# Patient Record
Sex: Female | Born: 1970 | Race: Black or African American | Hispanic: No | Smoking: Current every day smoker
Health system: Southern US, Community
[De-identification: ages and names within clinical notes are randomized; demographics above are authoritative.]

## PROBLEM LIST (undated history)

## (undated) DIAGNOSIS — R0600 Dyspnea, unspecified: Secondary | ICD-10-CM

## (undated) DIAGNOSIS — D649 Anemia, unspecified: Secondary | ICD-10-CM

## (undated) DIAGNOSIS — M1990 Unspecified osteoarthritis, unspecified site: Secondary | ICD-10-CM

## (undated) DIAGNOSIS — I10 Essential (primary) hypertension: Secondary | ICD-10-CM

## (undated) DIAGNOSIS — N939 Abnormal uterine and vaginal bleeding, unspecified: Secondary | ICD-10-CM

## (undated) DIAGNOSIS — R519 Headache, unspecified: Secondary | ICD-10-CM

## (undated) DIAGNOSIS — Z973 Presence of spectacles and contact lenses: Secondary | ICD-10-CM

## (undated) DIAGNOSIS — K219 Gastro-esophageal reflux disease without esophagitis: Secondary | ICD-10-CM

## (undated) DIAGNOSIS — N393 Stress incontinence (female) (male): Secondary | ICD-10-CM

## (undated) DIAGNOSIS — S83106A Unspecified dislocation of unspecified knee, initial encounter: Secondary | ICD-10-CM

## (undated) DIAGNOSIS — R7303 Prediabetes: Secondary | ICD-10-CM

## (undated) DIAGNOSIS — G8929 Other chronic pain: Secondary | ICD-10-CM

## (undated) DIAGNOSIS — R51 Headache: Secondary | ICD-10-CM

## (undated) HISTORY — PX: TUBAL LIGATION: SHX77

---

## 1998-11-12 ENCOUNTER — Emergency Department (HOSPITAL_COMMUNITY): Admission: EM | Admit: 1998-11-12 | Discharge: 1998-11-12 | Payer: Self-pay | Admitting: Emergency Medicine

## 2004-08-01 ENCOUNTER — Emergency Department (HOSPITAL_COMMUNITY): Admission: EM | Admit: 2004-08-01 | Discharge: 2004-08-01 | Payer: Self-pay | Admitting: *Deleted

## 2004-12-03 ENCOUNTER — Ambulatory Visit: Payer: Self-pay | Admitting: Family Medicine

## 2004-12-06 ENCOUNTER — Ambulatory Visit: Payer: Self-pay | Admitting: *Deleted

## 2004-12-24 ENCOUNTER — Ambulatory Visit: Payer: Self-pay | Admitting: Family Medicine

## 2005-02-04 ENCOUNTER — Ambulatory Visit: Payer: Self-pay | Admitting: Internal Medicine

## 2005-12-15 ENCOUNTER — Emergency Department (HOSPITAL_COMMUNITY): Admission: EM | Admit: 2005-12-15 | Discharge: 2005-12-15 | Payer: Self-pay | Admitting: Emergency Medicine

## 2006-02-03 ENCOUNTER — Emergency Department (HOSPITAL_COMMUNITY): Admission: EM | Admit: 2006-02-03 | Discharge: 2006-02-03 | Payer: Self-pay | Admitting: Emergency Medicine

## 2007-07-30 ENCOUNTER — Ambulatory Visit: Payer: Self-pay | Admitting: Family Medicine

## 2007-12-18 ENCOUNTER — Ambulatory Visit: Payer: Self-pay | Admitting: Family Medicine

## 2007-12-19 ENCOUNTER — Encounter (INDEPENDENT_AMBULATORY_CARE_PROVIDER_SITE_OTHER): Payer: Self-pay | Admitting: Family Medicine

## 2008-01-22 ENCOUNTER — Ambulatory Visit: Payer: Self-pay | Admitting: Family Medicine

## 2008-01-22 LAB — CONVERTED CEMR LAB
Basophils Absolute: 0.1 10*3/uL (ref 0.0–0.1)
CO2: 25 meq/L (ref 19–32)
Chloride: 105 meq/L (ref 96–112)
Cholesterol: 222 mg/dL — ABNORMAL HIGH (ref 0–200)
Creatinine, Ser: 1.13 mg/dL (ref 0.40–1.20)
Eosinophils Relative: 6 % — ABNORMAL HIGH (ref 0–5)
HCT: 42.4 % (ref 36.0–46.0)
LDL Cholesterol: 160 mg/dL — ABNORMAL HIGH (ref 0–99)
Lymphocytes Relative: 43 % (ref 12–46)
Lymphs Abs: 2 10*3/uL (ref 0.7–4.0)
Neutrophils Relative %: 40 % — ABNORMAL LOW (ref 43–77)
RDW: 15 % (ref 11.5–15.5)
TSH: 0.49 microintl units/mL (ref 0.350–5.50)
Total Bilirubin: 0.4 mg/dL (ref 0.3–1.2)
Total Protein: 7.8 g/dL (ref 6.0–8.3)
Triglycerides: 99 mg/dL (ref <150)
WBC: 4.7 10*3/uL (ref 4.0–10.5)

## 2008-02-25 ENCOUNTER — Ambulatory Visit: Payer: Self-pay | Admitting: Family Medicine

## 2008-02-25 ENCOUNTER — Encounter (INDEPENDENT_AMBULATORY_CARE_PROVIDER_SITE_OTHER): Payer: Self-pay | Admitting: Family Medicine

## 2008-02-25 LAB — CONVERTED CEMR LAB: Chlamydia, DNA Probe: NEGATIVE

## 2008-08-19 ENCOUNTER — Ambulatory Visit: Payer: Self-pay | Admitting: Internal Medicine

## 2008-08-21 ENCOUNTER — Ambulatory Visit: Payer: Self-pay | Admitting: Internal Medicine

## 2009-01-14 ENCOUNTER — Ambulatory Visit: Payer: Self-pay | Admitting: Internal Medicine

## 2009-01-15 ENCOUNTER — Ambulatory Visit: Payer: Self-pay | Admitting: Family Medicine

## 2009-08-05 ENCOUNTER — Emergency Department (HOSPITAL_COMMUNITY): Admission: EM | Admit: 2009-08-05 | Discharge: 2009-08-05 | Payer: Self-pay | Admitting: Emergency Medicine

## 2009-08-23 ENCOUNTER — Emergency Department (HOSPITAL_COMMUNITY): Admission: EM | Admit: 2009-08-23 | Discharge: 2009-08-23 | Payer: Self-pay | Admitting: Emergency Medicine

## 2009-11-05 ENCOUNTER — Emergency Department (HOSPITAL_COMMUNITY): Admission: EM | Admit: 2009-11-05 | Discharge: 2009-11-05 | Payer: Self-pay | Admitting: Emergency Medicine

## 2010-09-17 ENCOUNTER — Encounter (INDEPENDENT_AMBULATORY_CARE_PROVIDER_SITE_OTHER): Payer: Self-pay | Admitting: Family Medicine

## 2010-09-17 LAB — CONVERTED CEMR LAB
ALT: 15 units/L (ref 0–35)
AST: 21 units/L (ref 0–37)
Chlamydia, DNA Probe: NEGATIVE
Chloride: 103 meq/L (ref 96–112)
Cholesterol: 230 mg/dL — ABNORMAL HIGH (ref 0–200)
Glucose, Bld: 79 mg/dL (ref 70–99)
HDL: 42 mg/dL (ref >39)
Potassium: 5.3 meq/L (ref 3.5–5.3)
Sodium: 141 meq/L (ref 135–145)
TSH: 2.288 microintl units/mL (ref 0.350–4.500)
Total CHOL/HDL Ratio: 5.5
Total Protein: 7.4 g/dL (ref 6.0–8.3)
Triglycerides: 86 mg/dL (ref <150)

## 2011-05-29 ENCOUNTER — Emergency Department (HOSPITAL_COMMUNITY): Payer: Self-pay

## 2011-05-29 ENCOUNTER — Emergency Department (HOSPITAL_COMMUNITY)
Admission: EM | Admit: 2011-05-29 | Discharge: 2011-05-29 | Disposition: A | Discharge status: Home / Self Care | Payer: Self-pay | Attending: Emergency Medicine | Admitting: Emergency Medicine

## 2011-05-29 DIAGNOSIS — M6740 Ganglion, unspecified site: Secondary | ICD-10-CM | POA: Insufficient documentation

## 2011-05-29 DIAGNOSIS — M25539 Pain in unspecified wrist: Secondary | ICD-10-CM | POA: Insufficient documentation

## 2012-01-01 ENCOUNTER — Emergency Department (HOSPITAL_COMMUNITY): Payer: Self-pay

## 2012-01-01 ENCOUNTER — Encounter (HOSPITAL_COMMUNITY): Payer: Self-pay | Admitting: *Deleted

## 2012-01-01 ENCOUNTER — Emergency Department (HOSPITAL_COMMUNITY)
Admission: EM | Admit: 2012-01-01 | Discharge: 2012-01-01 | Disposition: A | Discharge status: Home / Self Care | Payer: Self-pay | Attending: Emergency Medicine | Admitting: Emergency Medicine

## 2012-01-01 DIAGNOSIS — Y9383 Activity, rough housing and horseplay: Secondary | ICD-10-CM | POA: Insufficient documentation

## 2012-01-01 DIAGNOSIS — S99919A Unspecified injury of unspecified ankle, initial encounter: Secondary | ICD-10-CM | POA: Insufficient documentation

## 2012-01-01 DIAGNOSIS — X500XXA Overexertion from strenuous movement or load, initial encounter: Secondary | ICD-10-CM | POA: Insufficient documentation

## 2012-01-01 DIAGNOSIS — M25569 Pain in unspecified knee: Secondary | ICD-10-CM | POA: Insufficient documentation

## 2012-01-01 DIAGNOSIS — S8990XA Unspecified injury of unspecified lower leg, initial encounter: Secondary | ICD-10-CM | POA: Insufficient documentation

## 2012-01-01 DIAGNOSIS — M25562 Pain in left knee: Secondary | ICD-10-CM

## 2012-01-01 DIAGNOSIS — R269 Unspecified abnormalities of gait and mobility: Secondary | ICD-10-CM | POA: Insufficient documentation

## 2012-01-01 DIAGNOSIS — Y92009 Unspecified place in unspecified non-institutional (private) residence as the place of occurrence of the external cause: Secondary | ICD-10-CM | POA: Insufficient documentation

## 2012-01-01 HISTORY — DX: Unspecified dislocation of unspecified knee, initial encounter: S83.106A

## 2012-01-01 MED ORDER — OXYCODONE-ACETAMINOPHEN 5-325 MG PO TABS
1.0000 | ORAL_TABLET | Freq: Once | ORAL | Status: AC
  Administered 2012-01-01: 1 via ORAL
  Filled 2012-01-01: qty 1

## 2012-01-01 MED ORDER — HYDROCODONE-ACETAMINOPHEN 5-325 MG PO TABS: 1.0000 | ORAL_TABLET | ORAL | Status: AC | PRN

## 2012-01-01 NOTE — ED Provider Notes (Signed)
History     CSN: [id number]  Arrival date & time 01/01/12  1433   First MD Initiated Contact with Patient 01/01/12 1510      Chief Complaint  Patient presents with  . Knee Injury    (Consider location/radiation/quality/duration/timing/severity/associated sxs/prior treatment) HPI History obtained from the patient. 41 year old female who states she was "wrestling" this afternoon, when her left leg/knee was twisted. She states she is currently unable to ambulate or bear weight on the extremity. Feels unable to fully extend the leg. She is able to move her extremity distally. She has not noticed any swelling, bruising, or gross deformity to the area. States she does have a history of dislocation of the knee, but otherwise has no past history of injury to the knee. She is not currently followed by an orthopedist.  Past Medical History  Diagnosis Date  . Knee dislocation     History reviewed. No pertinent past surgical history.  History reviewed. No pertinent family history.  History  Substance Use Topics  . Smoking status: Current Everyday Smoker -- 1.0 packs/day for 20 years  . Smokeless tobacco: Not on file  . Alcohol Use: Yes    OB History    Grav Para Term Preterm Abortions TAB SAB Ect Mult Living                  Review of Systems  Constitutional: Negative.   Musculoskeletal: Positive for arthralgias and gait problem. Negative for joint swelling.  Skin: Negative for color change and wound.  Neurological: Negative for weakness and numbness.    Allergies  Review of patient's allergies indicates no known allergies.  Home Medications  No current outpatient prescriptions on file.  LMP 12/31/2011  Physical Exam  Nursing note and vitals reviewed. Constitutional: She is oriented to person, place, and time. She appears well-developed and well-nourished. No distress.  HENT:  Head: Normocephalic and atraumatic.  Musculoskeletal:       Exam very limited, as the patient  is generally uncooperative with exam. There is no gross edema, ecchymoses, deformity noted to the knee. She has no bony tenderness. Patella is aligned. She is very mildly tender to palpation over the popliteal fossa, without evidence for Baker's cyst or gross joint effusion. The patella is not ballotable. Drawer and stress testing, although limited, did not reveal any gross ligamentous laxity. Unable to perform McMurray's.  Neurological: She is alert and oriented to person, place, and time.  Skin: She is not diaphoretic.    ED Course  Procedures (including critical care time)  Labs Reviewed - No data to display Dg Knee Complete 4 Views Left  01/01/2012  *RADIOLOGY REPORT*  Clinical Data: Twisted knee  LEFT KNEE - COMPLETE 4+ VIEW  Comparison: None.  Findings:  No fracture or dislocation of the left knee.  No joint effusion.  IMPRESSION: No fracture.  Original Report Authenticated By: Genevive Bi, M.D.   I personally reviewed the films; XR reviewed with pt.  1. Knee pain, left       MDM  41 year old female presenting after suffering an injury at home this afternoon. Her exam is very limited due to pain. I don't appreciate any gross effusion, edema, or deformity. Plain films are negative. We'll place her in a knee immobilizer and on crutches. She was instructed to practice RICE. She is instructed to follow up with orthopedist on improving in several days. Return precautions were discussed.        Allea Kassner, [place] 01/01/12 1710

## 2012-01-01 NOTE — ED Notes (Signed)
Patient was playing around, left leg got twisted. Pt c/o pain left knee with any type of movement. Unable to ambulate and bare any weight. Able to move foot and toes. Sensation present to extremity. Arrived per EMS>

## 2012-01-01 NOTE — Discharge Instructions (Signed)
Please wear the knee brace and use crutches for the next several days. Use the pain medicine as prescribed. Use ice, wrapped in a towel, to the area several times daily for 20 minutes at a time. Prop the knee up on a pillow. Please followup with Dr. supple if your knee is not improving for a recheck. Return to the ER for worsening condition.  Knee Pain The knee is the complex joint between your thigh and your lower leg. It is made up of bones, tendons, ligaments, and cartilage. The bones that make up the knee are:  The femur in the thigh.   The tibia and fibula in the lower leg.   The patella or kneecap riding in the groove on the lower femur.  CAUSES  Knee pain is a common complaint with many causes. A few of these causes are:  Injury, such as:   A ruptured ligament or tendon injury.   Torn cartilage.   Medical conditions, such as:   Gout   Arthritis   Infections   Overuse, over training or overdoing a physical activity.  Knee pain can be minor or severe. Knee pain can accompany debilitating injury. Minor knee problems often respond well to self-care measures or get well on their own. More serious injuries may need medical intervention or even surgery. SYMPTOMS The knee is complex. Symptoms of knee problems can vary widely. Some of the problems are:  Pain with movement and weight bearing.   Swelling and tenderness.   Buckling of the knee.   Inability to straighten or extend your knee.   Your knee locks and you cannot straighten it.   Warmth and redness with pain and fever.   Deformity or dislocation of the kneecap.  DIAGNOSIS  Determining what is wrong may be very straight forward such as when there is an injury. It can also be challenging because of the complexity of the knee. Tests to make a diagnosis may include:  Your caregiver taking a history and doing a physical exam.   Routine X-rays can be used to rule out other problems. X-rays will not reveal a cartilage  tear. Some injuries of the knee can be diagnosed by:   Arthroscopy a surgical technique by which a small video camera is inserted through tiny incisions on the sides of the knee. This procedure is used to examine and repair internal knee joint problems. Tiny instruments can be used during arthroscopy to repair the torn knee cartilage (meniscus).   Arthrography is a radiology technique. A contrast liquid is directly injected into the knee joint. Internal structures of the knee joint then become visible on X-ray film.   An MRI scan is a non x-ray radiology procedure in which magnetic fields and a computer produce two- or three-dimensional images of the inside of the knee. Cartilage tears are often visible using an MRI scanner. MRI scans have largely replaced arthrography in diagnosing cartilage tears of the knee.   Blood work.   Examination of the fluid that helps to lubricate the knee joint (synovial fluid). This is done by taking a sample out using a needle and a syringe.  TREATMENT The treatment of knee problems depends on the cause. Some of these treatments are:  Depending on the injury, proper casting, splinting, surgery or physical therapy care will be needed.   Give yourself adequate recovery time. Do not overuse your joints. If you begin to get sore during workout routines, back off. Slow down or do fewer repetitions.  For repetitive activities such as cycling or running, maintain your strength and nutrition.   Alternate muscle groups. For example if you are a weight lifter, work the upper body on one day and the lower body the next.   Either tight or weak muscles do not give the proper support for your knee. Tight or weak muscles do not absorb the stress placed on the knee joint. Keep the muscles surrounding the knee strong.   Take care of mechanical problems.   If you have flat feet, orthotics or special shoes may help. See your caregiver if you need help.   Arch supports,  sometimes with wedges on the inner or outer aspect of the heel, can help. These can shift pressure away from the side of the knee most bothered by osteoarthritis.   A brace called an "unloader" brace also may be used to help ease the pressure on the most arthritic side of the knee.   If your caregiver has prescribed crutches, braces, wraps or ice, use as directed. The acronym for this is PRICE. This means protection, rest, ice, compression and elevation.   Nonsteroidal anti-inflammatory drugs (NSAID's), can help relieve pain. But if taken immediately after an injury, they may actually increase swelling. Take NSAID's with food in your stomach. Stop them if you develop stomach problems. Do not take these if you have a history of ulcers, stomach pain or bleeding from the bowel. Do not take without your caregiver's approval if you have problems with fluid retention, heart failure, or kidney problems.   For ongoing knee problems, physical therapy may be helpful.   Glucosamine and chondroitin are over-the-counter dietary supplements. Both may help relieve the pain of osteoarthritis in the knee. These medicines are different from the usual anti-inflammatory drugs. Glucosamine may decrease the rate of cartilage destruction.   Injections of a corticosteroid drug into your knee joint may help reduce the symptoms of an arthritis flare-up. They may provide pain relief that lasts a few months. You may have to wait a few months between injections. The injections do have a small increased risk of infection, water retention and elevated blood sugar levels.   Hyaluronic acid injected into damaged joints may ease pain and provide lubrication. These injections may work by reducing inflammation. A series of shots may give relief for as long as 6 months.   Topical painkillers. Applying certain ointments to your skin may help relieve the pain and stiffness of osteoarthritis. Ask your pharmacist for suggestions. Many over  the-counter products are approved for temporary relief of arthritis pain.   In some countries, doctors often prescribe topical NSAID's for relief of chronic conditions such as arthritis and tendinitis. A review of treatment with NSAID creams found that they worked as well as oral medications but without the serious side effects.  PREVENTION  Maintain a healthy weight. Extra pounds put more strain on your joints.   Get strong, stay limber. Weak muscles are a common cause of knee injuries. Stretching is important. Include flexibility exercises in your workouts.   Be smart about exercise. If you have osteoarthritis, chronic knee pain or recurring injuries, you may need to change the way you exercise. This does not mean you have to stop being active. If your knees ache after jogging or playing basketball, consider switching to swimming, water aerobics or other low-impact activities, at least for a few days a week. Sometimes limiting high-impact activities will provide relief.   Make sure your shoes fit well. Choose footwear  that is right for your sport.   Protect your knees. Use the proper gear for knee-sensitive activities. Use kneepads when playing volleyball or laying carpet. Buckle your seat belt every time you drive. Most shattered kneecaps occur in car accidents.   Rest when you are tired.  SEEK MEDICAL CARE IF:  You have knee pain that is continual and does not seem to be getting better.  SEEK IMMEDIATE MEDICAL CARE IF:  Your knee joint feels hot to the touch and you have a high fever. MAKE SURE YOU:   Understand these instructions.   Will watch your condition.   Will get help right away if you are not doing well or get worse.  Document Released: 08/14/2007 Document Revised: 10/06/2011 Document Reviewed: 08/14/2007 [facility] Patient Information 2012 [place], [place].

## 2012-01-01 NOTE — ED Notes (Signed)
Per EMS- pt in c/o left knee injury, unable to move knee, no obvious deformity but has history of knee dislocation

## 2012-01-03 NOTE — ED Provider Notes (Signed)
Medical screening examination/treatment/procedure(s) were performed by non-physician practitioner and as supervising physician I was immediately available for consultation/collaboration.   Laray Anger, DO 01/03/12 2323

## 2012-08-15 ENCOUNTER — Emergency Department (HOSPITAL_COMMUNITY): Payer: Self-pay

## 2012-08-15 ENCOUNTER — Encounter (HOSPITAL_COMMUNITY): Payer: Self-pay | Admitting: *Deleted

## 2012-08-15 ENCOUNTER — Emergency Department (HOSPITAL_COMMUNITY)
Admission: EM | Admit: 2012-08-15 | Discharge: 2012-08-15 | Disposition: A | Discharge status: Home / Self Care | Payer: Self-pay | Attending: Emergency Medicine | Admitting: Emergency Medicine

## 2012-08-15 DIAGNOSIS — N730 Acute parametritis and pelvic cellulitis: Secondary | ICD-10-CM | POA: Insufficient documentation

## 2012-08-15 DIAGNOSIS — A599 Trichomoniasis, unspecified: Secondary | ICD-10-CM | POA: Insufficient documentation

## 2012-08-15 DIAGNOSIS — R109 Unspecified abdominal pain: Secondary | ICD-10-CM | POA: Insufficient documentation

## 2012-08-15 HISTORY — DX: Headache: R51

## 2012-08-15 HISTORY — DX: Headache, unspecified: R51.9

## 2012-08-15 HISTORY — DX: Other chronic pain: G89.29

## 2012-08-15 LAB — PREGNANCY, URINE: Preg Test, Ur: NEGATIVE

## 2012-08-15 LAB — URINALYSIS, ROUTINE W REFLEX MICROSCOPIC
Bilirubin Urine: NEGATIVE
Glucose, UA: NEGATIVE mg/dL
Ketones, ur: NEGATIVE mg/dL
Nitrite: NEGATIVE
Protein, ur: NEGATIVE mg/dL
Specific Gravity, Urine: 1.021 (ref 1.005–1.030)

## 2012-08-15 LAB — WET PREP, GENITAL: Clue Cells Wet Prep HPF POC: NONE SEEN

## 2012-08-15 MED ORDER — ACETAMINOPHEN-CODEINE #3 300-30 MG PO TABS: 1.0000 | ORAL_TABLET | Freq: Four times a day (QID) | ORAL | Status: DC | PRN

## 2012-08-15 MED ORDER — IBUPROFEN 600 MG PO TABS: 600.0000 mg | ORAL_TABLET | Freq: Four times a day (QID) | ORAL | Status: DC | PRN

## 2012-08-15 MED ORDER — CEFTRIAXONE SODIUM 250 MG IJ SOLR
250.0000 mg | Freq: Once | INTRAMUSCULAR | Status: AC
  Administered 2012-08-15: 250 mg via INTRAMUSCULAR
  Filled 2012-08-15: qty 250

## 2012-08-15 MED ORDER — DOXYCYCLINE HYCLATE 100 MG PO CAPS: 100.0000 mg | ORAL_CAPSULE | Freq: Two times a day (BID) | ORAL | Status: DC

## 2012-08-15 MED ORDER — LIDOCAINE HCL (PF) 1 % IJ SOLN
INTRAMUSCULAR | Status: AC
  Administered 2012-08-15: 11:00:00
  Filled 2012-08-15: qty 5

## 2012-08-15 MED ORDER — AZITHROMYCIN 1 G PO PACK
1.0000 g | PACK | Freq: Once | ORAL | Status: AC
  Administered 2012-08-15: 1 g via ORAL
  Filled 2012-08-15: qty 1

## 2012-08-15 MED ORDER — HYDROCODONE-ACETAMINOPHEN 5-325 MG PO TABS
1.0000 | ORAL_TABLET | Freq: Once | ORAL | Status: AC
  Administered 2012-08-15: 1 via ORAL
  Filled 2012-08-15: qty 1

## 2012-08-15 MED ORDER — METRONIDAZOLE 500 MG PO TABS
2000.0000 mg | ORAL_TABLET | Freq: Once | ORAL | Status: AC
  Administered 2012-08-15: 2000 mg via ORAL
  Filled 2012-08-15: qty 4

## 2012-08-15 NOTE — ED Provider Notes (Signed)
History     CSN: [id number]  Arrival date & time 08/15/12  4098   First MD Initiated Contact with Patient 08/15/12 [phone]      Chief Complaint  Patient presents with  . Abdominal Pain    (Consider location/radiation/quality/duration/timing/severity/associated sxs/prior treatment) HPI Comments: 40 Y/O FEMALE S/ TUBAL LIGATION COMES IN WITH CC OF ABD PAIN. Pt has suprapubic abd pain that woke her up from her sleep last night. The pain is sharp, constant with no specific aggravating or relieving factos. She has no associated n/v/f/c. Pt has no UTI like sx, and she has no hx of renal stones. The pain is non radiating and located on both the left and right side. No vaginal discharge, she is having some bleeding as she is on her periods.   Patient is a 41 y.o. female presenting with abdominal pain. The history is provided by the patient.  Abdominal Pain The primary symptoms of the illness include abdominal pain and vaginal bleeding. The primary symptoms of the illness do not include shortness of breath, nausea, vomiting, diarrhea or dysuria.  Symptoms associated with the illness do not include constipation or hematuria.    Past Medical History  Diagnosis Date  . Knee dislocation   . Chronic headaches     Past Surgical History  Procedure Date  . Tubal ligation     No family history on file.  History  Substance Use Topics  . Smoking status: Current Every Day Smoker -- 1.0 packs/day for 20 years  . Smokeless tobacco: Not on file  . Alcohol Use: Yes    OB History    Grav Para Term Preterm Abortions TAB SAB Ect Mult Living                  Review of Systems  Constitutional: Positive for activity change.  HENT: Negative for facial swelling and neck pain.   Respiratory: Negative for cough, shortness of breath and wheezing.   Cardiovascular: Negative for chest pain.  Gastrointestinal: Positive for abdominal pain. Negative for nausea, vomiting, diarrhea, constipation, blood in  stool and abdominal distention.  Genitourinary: Positive for vaginal bleeding. Negative for dysuria, hematuria, flank pain and difficulty urinating.  Skin: Negative for color change.  Neurological: Negative for speech difficulty and headaches.  Hematological: Does not bruise/bleed easily.  Psychiatric/Behavioral: Negative for confusion.    Allergies  Review of patient's allergies indicates no known allergies.  Home Medications   Current Outpatient Rx  Name Route Sig Dispense Refill  . BC FAST PAIN RELIEF PO Oral Take by mouth every 6 (six) hours as needed. For pain      BP 135/89  Pulse 67  Temp 97.7 F (36.5 C) (Oral)  Resp 20  SpO2 97%  Physical Exam  Nursing note and vitals reviewed. Constitutional: She is oriented to person, place, and time. She appears well-developed.  HENT:  Head: Normocephalic and atraumatic.  Eyes: Conjunctivae normal and EOM are normal. Pupils are equal, round, and reactive to light.  Neck: Normal range of motion. Neck supple.  Cardiovascular: Normal rate, regular rhythm, normal heart sounds and intact distal pulses.   No murmur heard. Pulmonary/Chest: Effort normal. No respiratory distress. She has no wheezes.  Abdominal: Soft. Bowel sounds are normal. She exhibits no distension. There is tenderness. There is guarding. There is no rebound.       Suprapubic tenderness  Genitourinary: Vagina normal and uterus normal.       External exam - normal, no lesions Speculum exam:+  bloody discharge Bimanual exam: Patient has + CMT, no adnexal tenderness or fullness and cervical os is closed  Neurological: She is alert and oriented to person, place, and time.  Skin: Skin is warm and dry.    ED Course  Procedures (including critical care time)   Labs Reviewed  URINALYSIS, ROUTINE W REFLEX MICROSCOPIC  PREGNANCY, URINE  GC/CHLAMYDIA PROBE AMP, GENITAL  WET PREP, GENITAL   No results found.   No diagnosis found.    MDM  DDx includes: Ovarian  cyst TOA Ectopic pregnancy PID STD Thrombosis Mesenteric ischemia Diverticulitis Peritonitis Appendicitis Hernia Nephrolithiasis Pyelonephritis UTI/Cystitis  Healthy woman comes in with cc of abd pain. Suprapubic, with no specific aggravating or relieving factors, with some guarding on her exam. She also has CMT. The pain is quite severe, and she has subjective fevers, chills - will get [place] to make sure there is no abscess, but likely this is just PID.    Derwood Kaplan, MD 08/15/12 [phone]

## 2012-08-15 NOTE — ED Notes (Signed)
Patient transported to CT 

## 2012-08-15 NOTE — ED Notes (Signed)
Pt with lmp started 2 days ago states she awoke at 3 am with pelvic cramping.  States "I feel like I'm having another baby".

## 2012-08-16 LAB — GC/CHLAMYDIA PROBE AMP, GENITAL
Chlamydia, DNA Probe: NEGATIVE
GC Probe Amp, Genital: NEGATIVE

## 2013-05-29 ENCOUNTER — Emergency Department (HOSPITAL_COMMUNITY)
Admission: EM | Admit: 2013-05-29 | Discharge: 2013-05-29 | Disposition: A | Discharge status: Home / Self Care | Payer: Self-pay | Attending: Emergency Medicine | Admitting: Emergency Medicine

## 2013-05-29 ENCOUNTER — Encounter (HOSPITAL_COMMUNITY): Payer: Self-pay

## 2013-05-29 DIAGNOSIS — H6123 Impacted cerumen, bilateral: Secondary | ICD-10-CM

## 2013-05-29 DIAGNOSIS — Z8679 Personal history of other diseases of the circulatory system: Secondary | ICD-10-CM | POA: Insufficient documentation

## 2013-05-29 DIAGNOSIS — H6120 Impacted cerumen, unspecified ear: Secondary | ICD-10-CM | POA: Insufficient documentation

## 2013-05-29 DIAGNOSIS — F17200 Nicotine dependence, unspecified, uncomplicated: Secondary | ICD-10-CM | POA: Insufficient documentation

## 2013-05-29 MED ORDER — DOCUSATE SODIUM 50 MG/5ML PO LIQD
5.0000 mg | Freq: Once | ORAL | Status: AC
  Administered 2013-05-29: 5 mg via OTIC
  Filled 2013-05-29: qty 10

## 2013-05-29 MED ORDER — CARBAMIDE PEROXIDE 6.5 % OT SOLN: 3.0000 [drp] | Freq: Two times a day (BID) | OTIC | Status: DC

## 2013-05-29 MED ORDER — DOCUSATE SODIUM 50 MG/5ML PO LIQD
10.0000 mg | Freq: Once | ORAL | Status: DC
  Filled 2013-05-29: qty 10

## 2013-05-29 NOTE — ED Notes (Signed)
Pt c/o lt ear pain after cleaning with a q tip on saturday

## 2013-05-29 NOTE — ED Provider Notes (Signed)
CSN: [id number]     Arrival date & time 05/29/13  4098 History     First MD Initiated Contact with Patient 05/29/13 1011     Chief Complaint  Patient presents with  . Otalgia   (Consider location/radiation/quality/duration/timing/severity/associated sxs/prior Treatment) The history is provided by the patient. No language interpreter was used.  Kristin Dean is a 42 y/o F with PMHx of chronic headaches presenting to the ED with left ear pain that started on Saturday after cleaning her ears with a qtip. Patient reported that the pain occurred instantaneously after using the qtip, described as a constant soreness to the left ear with mild muffled sounds, with radiation of discomfort to the left temple region. Stated that she has used nothing for the discomfort. Denied fever, chills, neck pain, neck stiffness, back pain, swelling of the ears, otorrhea. PCP none   Past Medical History  Diagnosis Date  . Knee dislocation   . Chronic headaches    Past Surgical History  Procedure Laterality Date  . Tubal ligation     No family history on file. History  Substance Use Topics  . Smoking status: Current Every Day Smoker -- 1.00 packs/day for 20 years  . Smokeless tobacco: Not on file  . Alcohol Use: Yes     Comment: social   OB History   Grav Para Term Preterm Abortions TAB SAB Ect Mult Living                 Review of Systems  Constitutional: Negative for fever and chills.  HENT: Positive for ear pain (left). Negative for congestion, sore throat, trouble swallowing, neck pain, neck stiffness, sinus pressure and ear discharge.   Eyes: Negative for visual disturbance.  Respiratory: Negative for cough, chest tightness and shortness of breath.   Cardiovascular: Negative for chest pain.  Neurological: Negative for weakness and numbness.  All other systems reviewed and are negative.    Allergies  Review of patient's allergies indicates no known allergies.  Home Medications    Current Outpatient Rx  Name  Route  Sig  Dispense  Refill  . carbamide peroxide (DEBROX) 6.5 % otic solution   Both Ears   Place 3 drops into both ears 2 (two) times daily.   15 mL   0    BP 128/66  Pulse 72  Temp(Src) 98 F (36.7 C) (Oral)  Resp 22  SpO2 98%  LMP 05/22/2013 Physical Exam  Nursing note and vitals reviewed. Constitutional: She is oriented to person, place, and time. She appears well-developed and well-nourished. No distress.  HENT:  Head: Normocephalic and atraumatic.  Negative pain upon palpation to the face Bilateral cerumen impaction  Eyes: Conjunctivae and EOM are normal. Pupils are equal, round, and reactive to light. Right eye exhibits no discharge. Left eye exhibits no discharge.  Neck: Normal range of motion. Neck supple. No tracheal deviation present.  Negative pain upon palpation to the neck Negative neck stiffness Negative nuchal rigidity  Cardiovascular: Normal rate, regular rhythm and normal heart sounds.  Exam reveals no friction rub.   No murmur heard. Pulmonary/Chest: Effort normal and breath sounds normal. No respiratory distress. She has no wheezes. She has no rales.  Lymphadenopathy:    She has no cervical adenopathy.  Neurological: She is alert and oriented to person, place, and time. She exhibits normal muscle tone. Coordination normal.  Skin: Skin is warm and dry. No rash noted. She is not diaphoretic. No erythema.  Psychiatric: She has a  normal mood and affect. Her behavior is normal. Thought content normal.    ED Course   Procedures (including critical care time)  Medications  docusate (COLACE) 50 MG/5ML liquid 5 mg (5 mg Both Ears Given 05/29/13 1116)    Labs Reviewed - No data to display No results found. 1. Cerumen impaction, bilateral     MDM  Patient presenting to the ED with otalgia to the left ear that started Saturday after patient used qtip to clean ears. Cerumen impaction bilaterally - negative drainage,  swelling, erythema, inflammation noted to the canals bilaterally. Negative sign of otitis externa. Negative dental problems or issues noted. Colace placed in ears bilaterally - small amount of wax removed with curette. Patient stable, afebrile. Cerumen impaction noted, bilaterally. Discharged patient with [place]brox and ENT follow-up. Discussed with patient to no longer use qtips - discussed proper cleaning with rag. Discussed with patient to continue to monitor symptoms and if symptoms are to worsen or change to report back to the ED - strict return instructions given.  Patient agreed to plan of care, understood, all questions answered.   Raymon Mutton, PA-C 05/29/13 2128

## 2013-05-29 NOTE — Progress Notes (Signed)
[facility] provided pt with a list of primary care resources and a [facility] app.  °

## 2013-05-30 NOTE — ED Provider Notes (Signed)
  Medical screening examination/treatment/procedure(s) were performed by non-physician practitioner and as supervising physician I was immediately available for consultation/collaboration.   Gila Lauf, MD 05/30/13 0722 

## 2013-08-29 ENCOUNTER — Telehealth (HOSPITAL_COMMUNITY): Payer: Self-pay | Admitting: *Deleted

## 2013-08-29 NOTE — Telephone Encounter (Signed)
Telephone patient at home # and left message to return call to [facility] about a mammogram

## 2013-10-03 ENCOUNTER — Other Ambulatory Visit: Payer: Self-pay | Admitting: Obstetrics and Gynecology

## 2013-10-03 DIAGNOSIS — Z1231 Encounter for screening mammogram for malignant neoplasm of breast: Secondary | ICD-10-CM

## 2013-10-22 ENCOUNTER — Ambulatory Visit (HOSPITAL_COMMUNITY)
Admission: RE | Admit: 2013-10-22 | Discharge: 2013-10-22 | Disposition: A | Discharge status: Home / Self Care | Payer: Self-pay | Source: Ambulatory Visit | Attending: Obstetrics and Gynecology | Admitting: Obstetrics and Gynecology

## 2013-10-22 ENCOUNTER — Encounter (HOSPITAL_COMMUNITY): Payer: Self-pay

## 2013-10-22 VITALS — BP 116/64 | Temp 98.3°F | Ht 63.0 in | Wt 223.2 lb

## 2013-10-22 DIAGNOSIS — Z1231 Encounter for screening mammogram for malignant neoplasm of breast: Secondary | ICD-10-CM

## 2013-10-22 DIAGNOSIS — Z01419 Encounter for gynecological examination (general) (routine) without abnormal findings: Secondary | ICD-10-CM

## 2013-10-22 NOTE — Progress Notes (Signed)
Patient complained of AUB.  Pap Smear:    Pap smear completed today. Patients last Pap smear was 09/17/2010 and normal. Per patient has no history of an abnormal Pap smear. Pap smear result above is in EPIC.  Physical exam: Breasts Breasts symmetrical. No skin abnormalities bilateral breasts. No nipple retraction bilateral breasts. No nipple discharge bilateral breasts. No lymphadenopathy. No lumps palpated bilateral breasts. No complaints of pain or tenderness on exam. Patient escorted to mammography for a screening mammogram.          Pelvic/Bimanual   Ext Genitalia No lesions, no swelling and no discharge observed on external genitalia.         Vagina Vagina pink and normal texture. No lesions or discharge observed in vagina.          Cervix Cervix is present. Cervix pink and of normal texture. Cervix friable. No discharge observed. Will refer patient to the [facility] for the AUB.    Uterus Uterus is present and palpable. Uterus in normal position and normal size.        Adnexae Bilateral ovaries present and palpable. No tenderness on palpation.          Rectovaginal No rectal exam completed today since patient had no rectal complaints. No skin abnormalities observed on exam.

## 2013-10-22 NOTE — Patient Instructions (Addendum)
Taught [name] how to perform BSE and gave educational materials to take home. Let her know [facility] will cover Pap smears every 3 years unless has a history of abnormal Pap smears. Let patient know will follow up with her within the next couple weeks with results by letter and/or phone. Patient complained of AUB. Let patient know will refer her to the [facility] for follow up and that [facility] does not cover. Told her will call her with follow up appointment. [name] verbalized understanding. Patient escorted to mammography for a screening mammogram.  Jamieka Royle, Kathaleen Maser, RN 2:29 PM

## 2013-12-05 ENCOUNTER — Encounter: Payer: Self-pay | Admitting: Family Medicine

## 2014-01-22 ENCOUNTER — Encounter: Payer: Self-pay | Admitting: Family Medicine

## 2014-06-27 ENCOUNTER — Emergency Department (HOSPITAL_COMMUNITY)
Admission: EM | Admit: 2014-06-27 | Discharge: 2014-06-28 | Disposition: A | Discharge status: Home / Self Care | Payer: No Typology Code available for payment source | Attending: Emergency Medicine | Admitting: Emergency Medicine

## 2014-06-27 ENCOUNTER — Encounter (HOSPITAL_COMMUNITY): Payer: Self-pay | Admitting: Emergency Medicine

## 2014-06-27 DIAGNOSIS — G8929 Other chronic pain: Secondary | ICD-10-CM | POA: Insufficient documentation

## 2014-06-27 DIAGNOSIS — S93409A Sprain of unspecified ligament of unspecified ankle, initial encounter: Secondary | ICD-10-CM | POA: Diagnosis not present

## 2014-06-27 DIAGNOSIS — Y9289 Other specified places as the place of occurrence of the external cause: Secondary | ICD-10-CM | POA: Diagnosis not present

## 2014-06-27 DIAGNOSIS — X500XXA Overexertion from strenuous movement or load, initial encounter: Secondary | ICD-10-CM | POA: Diagnosis not present

## 2014-06-27 DIAGNOSIS — Y9389 Activity, other specified: Secondary | ICD-10-CM | POA: Insufficient documentation

## 2014-06-27 DIAGNOSIS — Z8781 Personal history of (healed) traumatic fracture: Secondary | ICD-10-CM | POA: Diagnosis not present

## 2014-06-27 DIAGNOSIS — F17200 Nicotine dependence, unspecified, uncomplicated: Secondary | ICD-10-CM | POA: Insufficient documentation

## 2014-06-27 DIAGNOSIS — Z79899 Other long term (current) drug therapy: Secondary | ICD-10-CM | POA: Insufficient documentation

## 2014-06-27 DIAGNOSIS — S99929A Unspecified injury of unspecified foot, initial encounter: Secondary | ICD-10-CM | POA: Diagnosis present

## 2014-06-27 DIAGNOSIS — S93402A Sprain of unspecified ligament of left ankle, initial encounter: Secondary | ICD-10-CM

## 2014-06-27 DIAGNOSIS — S8990XA Unspecified injury of unspecified lower leg, initial encounter: Secondary | ICD-10-CM | POA: Insufficient documentation

## 2014-06-27 DIAGNOSIS — S99919A Unspecified injury of unspecified ankle, initial encounter: Secondary | ICD-10-CM

## 2014-06-27 MED ORDER — HYDROCODONE-ACETAMINOPHEN 5-325 MG PO TABS
2.0000 | ORAL_TABLET | Freq: Once | ORAL | Status: AC
  Administered 2014-06-27: 2 via ORAL
  Filled 2014-06-27: qty 2

## 2014-06-27 NOTE — ED Notes (Signed)
Pt. twisted her left ankle while ambulating this evening , presents with pain and mild swelling at left ankle.

## 2014-06-27 NOTE — ED Provider Notes (Signed)
CSN: [id number]     Arrival date & time 06/27/14  2234 History   First MD Initiated Contact with Patient 06/27/14 2245     Chief Complaint  Patient presents with  . Ankle Injury   Patient is a 43 y.o. female presenting with lower extremity injury.  Ankle Injury Pertinent negatives include no numbness or weakness.    History provided by the patient. Patient is a 43 year old female presenting with left ankle pain and injury. She reports walking outside returning home when she twisted her left ankle. She had acute pains which have been worsening significantly. She was able to bear weight on the ankle and foot but this is becoming more difficult. She reports swelling to the lateral aspect. Denies any weakness or numbness. No other injuries or complaints. Patient did use 3 ibuprofen at home and ice with elevation. No significant change in symptoms.   Past Medical History  Diagnosis Date  . Knee dislocation   . Chronic headaches    Past Surgical History  Procedure Laterality Date  . Tubal ligation     Family History  Problem Relation Age of Onset  . Diabetes Mother   . Hypertension Father   . Breast cancer Maternal Aunt    History  Substance Use Topics  . Smoking status: Current Every Day Smoker -- 1.00 packs/day for 20 years  . Smokeless tobacco: Not on file  . Alcohol Use: Yes     Comment: social   OB History   Grav Para Term Preterm Abortions TAB SAB Ect Mult Living   Review of Systems  Neurological: Negative for weakness and numbness.  All other systems reviewed and are negative.     Allergies  Review of patient's allergies indicates no known allergies.  Home Medications   Prior to Admission medications   Medication Sig Start Date End Date Taking? Authorizing Provider  carbamide peroxide (DEBROX) 6.5 % otic solution Place 3 drops into both ears 2 (two) times daily. 05/29/13   Marissa Sciacca, PA-C   BP 118/73  Pulse 86  Temp(Src) 98.2 F (36.8  C) (Oral)  Resp 16  SpO2 100%  LMP 06/10/2014 Physical Exam  Nursing note and vitals reviewed. Constitutional: She is oriented to person, place, and time. She appears well-developed and well-nourished. No distress.  HENT:  Head: Normocephalic and atraumatic.  Cardiovascular: Normal rate and regular rhythm.   Pulmonary/Chest: Effort normal and breath sounds normal. No respiratory distress.  Musculoskeletal: She exhibits edema.  Moderate swelling over the left lateral malleolus of the ankle. There is tenderness throughout this area. No tenderness or swelling over the proximal fifth metatarsal. No pain or deformity over the medial malleolus. No proximal fibular tenderness. Normal knee exam. Dorsal pedal pulses and sensation in the foot.  Neurological: She is alert and oriented to person, place, and time.  Skin: Skin is warm and dry. No rash noted.  Psychiatric: She has a normal mood and affect. Her behavior is normal.    ED Course  Procedures   COORDINATION OF CARE:  Nursing notes reviewed. Vital signs reviewed. Initial pt interview and examination performed.   Filed Vitals:   06/27/14 2241  BP: 118/73  Pulse: 86  Temp: 98.2 F (36.8 C)  TempSrc: Oral  Resp: 16  SpO2: 100%    11:09 PM-patient seen and evaluated. She appears well, in no acute distress. X-rays ordered.   x-rays reviewed with the patient.  No fractures or dislocations. Suspect ankle sprain. ASO and crutches provided.  Treatment plan initiated: Medications  HYDROcodone-acetaminophen (NORCO/VICODIN) 5-325 MG per tablet 2 tablet (2 tablets Oral Given 06/27/14 2253)     Imaging Review Dg Ankle Complete Left  06/28/2014   CLINICAL DATA:  Twisting injury to the left ankle. Pain and lateral swelling.  EXAM: LEFT ANKLE COMPLETE - 3+ VIEW  COMPARISON:  None.  FINDINGS: Lateral soft tissue swelling. No acute fracture or dislocation. Soft tissue calcifications, likely phleboliths. Os trigone. Small plantar calcaneal  spur.  IMPRESSION: Lateral soft tissue swelling.  No acute fracture or dislocation.   Electronically Signed   By: Burman Nieves M.D.   On: 06/28/2014 00:50     MDM   Final diagnoses:  Ankle sprain, left, initial encounter        Angus Seller, PA-C 06/28/14 2238

## 2014-06-28 ENCOUNTER — Emergency Department (HOSPITAL_COMMUNITY): Payer: No Typology Code available for payment source

## 2014-06-28 MED ORDER — NAPROXEN 500 MG PO TABS: 500.0000 mg | ORAL_TABLET | Freq: Two times a day (BID) | ORAL | Status: DC

## 2014-06-28 NOTE — ED Notes (Signed)
Pt to xray

## 2014-06-28 NOTE — ED Notes (Addendum)
Pt was instructed on how to apply aso ankle and walk on crutches. Demonstrated understanding.

## 2014-06-28 NOTE — ED Notes (Signed)
Pt back from x-ray.

## 2014-06-28 NOTE — Discharge Instructions (Signed)
Your x-rays do not show any broken bones or other concerning injury. Your providers feel you have a sprained ankle. Use rest, ice, compression and elevation to reduce pain and swelling.   Ankle Sprain An ankle sprain is an injury to the strong, fibrous tissues (ligaments) that hold the bones of your ankle joint together.  CAUSES An ankle sprain is usually caused by a fall or by twisting your ankle. Ankle sprains most commonly occur when you step on the outer edge of your foot, and your ankle turns inward. People who participate in sports are more prone to these types of injuries.  SYMPTOMS   Pain in your ankle. The pain may be present at rest or only when you are trying to stand or walk.  Swelling.  Bruising. Bruising may develop immediately or within 1 to 2 days after your injury.  Difficulty standing or walking, particularly when turning corners or changing directions. DIAGNOSIS  Your caregiver will ask you details about your injury and perform a physical exam of your ankle to determine if you have an ankle sprain. During the physical exam, your caregiver will press on and apply pressure to specific areas of your foot and ankle. Your caregiver will try to move your ankle in certain ways. An X-ray exam may be done to be sure a bone was not broken or a ligament did not separate from one of the bones in your ankle (avulsion fracture).  TREATMENT  Certain types of braces can help stabilize your ankle. Your caregiver can make a recommendation for this. Your caregiver may recommend the use of medicine for pain. If your sprain is severe, your caregiver may refer you to a surgeon who helps to restore function to parts of your skeletal system (orthopedist) or a physical therapist. HOME CARE INSTRUCTIONS   Apply ice to your injury for 1-2 days or as directed by your caregiver. Applying ice helps to reduce inflammation and pain.  Put ice in a plastic bag.  Place a towel between your skin and the  bag.  Leave the ice on for 15-20 minutes at a time, every 2 hours while you are awake.  Only take over-the-counter or prescription medicines for pain, discomfort, or fever as directed by your caregiver.  Elevate your injured ankle above the level of your heart as much as possible for 2-3 days.  If your caregiver recommends crutches, use them as instructed. Gradually put weight on the affected ankle. Continue to use crutches or a cane until you can walk without feeling pain in your ankle.  If you have a plaster splint, wear the splint as directed by your caregiver. Do not rest it on anything harder than a pillow for the first 24 hours. Do not put weight on it. Do not get it wet. You may take it off to take a shower or bath.  You may have been given an elastic bandage to wear around your ankle to provide support. If the elastic bandage is too tight (you have numbness or tingling in your foot or your foot becomes cold and blue), adjust the bandage to make it comfortable.  If you have an air splint, you may blow more air into it or let air out to make it more comfortable. You may take your splint off at night and before taking a shower or bath. Wiggle your toes in the splint several times per day to decrease swelling. SEEK MEDICAL CARE IF:   You have rapidly increasing bruising or swelling.  Your toes feel extremely cold or you lose feeling in your foot.  Your pain is not relieved with medicine. SEEK IMMEDIATE MEDICAL CARE IF:  Your toes are numb or blue.  You have severe pain that is increasing. MAKE SURE YOU:   Understand these instructions.  Will watch your condition.  Will get help right away if you are not doing well or get worse. Document Released: 10/17/2005 Document Revised: 07/11/2012 Document Reviewed: 10/29/2011 [facility] Patient Information 2015 [place], [place]. This information is not intended to replace advice given to you by your health care provider. Make sure you  discuss any questions you have with your health care provider.

## 2014-07-07 NOTE — ED Provider Notes (Signed)
Medical screening examination/treatment/procedure(s) were performed by non-physician practitioner and as supervising physician I was immediately available for consultation/collaboration.   EKG Interpretation None       Jomarion Mish R. Delphin Funes, MD 07/07/14 1439 

## 2014-09-01 ENCOUNTER — Encounter (HOSPITAL_COMMUNITY): Payer: Self-pay | Admitting: Emergency Medicine

## 2014-10-21 ENCOUNTER — Other Ambulatory Visit: Payer: Self-pay | Admitting: Obstetrics and Gynecology

## 2014-10-21 DIAGNOSIS — Z1231 Encounter for screening mammogram for malignant neoplasm of breast: Secondary | ICD-10-CM

## 2014-11-27 ENCOUNTER — Ambulatory Visit (HOSPITAL_COMMUNITY): Admission: RE | Admit: 2014-11-27 | Payer: Self-pay | Source: Ambulatory Visit

## 2014-11-27 ENCOUNTER — Ambulatory Visit (HOSPITAL_COMMUNITY): Payer: Self-pay | Attending: Obstetrics and Gynecology

## 2015-08-03 ENCOUNTER — Emergency Department (HOSPITAL_COMMUNITY)
Admission: EM | Admit: 2015-08-03 | Discharge: 2015-08-03 | Disposition: A | Discharge status: Home / Self Care | Payer: No Typology Code available for payment source | Attending: Emergency Medicine | Admitting: Emergency Medicine

## 2015-08-03 ENCOUNTER — Encounter (HOSPITAL_COMMUNITY): Payer: Self-pay | Admitting: *Deleted

## 2015-08-03 ENCOUNTER — Emergency Department (HOSPITAL_COMMUNITY): Payer: No Typology Code available for payment source

## 2015-08-03 DIAGNOSIS — J40 Bronchitis, not specified as acute or chronic: Secondary | ICD-10-CM

## 2015-08-03 DIAGNOSIS — Z87828 Personal history of other (healed) physical injury and trauma: Secondary | ICD-10-CM | POA: Insufficient documentation

## 2015-08-03 DIAGNOSIS — G933 Postviral fatigue syndrome: Secondary | ICD-10-CM | POA: Insufficient documentation

## 2015-08-03 DIAGNOSIS — G8929 Other chronic pain: Secondary | ICD-10-CM | POA: Insufficient documentation

## 2015-08-03 DIAGNOSIS — R05 Cough: Secondary | ICD-10-CM

## 2015-08-03 DIAGNOSIS — Z791 Long term (current) use of non-steroidal anti-inflammatories (NSAID): Secondary | ICD-10-CM | POA: Insufficient documentation

## 2015-08-03 DIAGNOSIS — R058 Other specified cough: Secondary | ICD-10-CM

## 2015-08-03 DIAGNOSIS — Z720 Tobacco use: Secondary | ICD-10-CM

## 2015-08-03 DIAGNOSIS — R059 Cough, unspecified: Secondary | ICD-10-CM

## 2015-08-03 MED ORDER — BENZONATATE 200 MG PO CAPS: 200.0000 mg | ORAL_CAPSULE | Freq: Three times a day (TID) | ORAL | Status: DC | PRN

## 2015-08-03 MED ORDER — AZITHROMYCIN 250 MG PO TABS: ORAL_TABLET | ORAL | Status: DC

## 2015-08-03 NOTE — ED Notes (Signed)
Declined W/C at D/C and was escorted to lobby by RN. 

## 2015-08-03 NOTE — Discharge Instructions (Signed)
Continue to stay well-hydrated. Continue to alternate between Tylenol and Ibuprofen for pain or fever. Use Mucinex for cough suppression/expectoration of mucus. Use Tesslon pearls for cough suppression. Use netipot and flonase to help with nasal congestion. May consider over-the-counter Benadryl or other antihistamine to decrease secretions and for watery itchy eyes. STOP SMOKING! If you develop a fever or the cough worsens abruptly, then start taking the antibiotic as prescribed. Use the list below to find a regular doctor. You can follow up with them or with the women's clinic for any ongoing mammogram concerns or needs. Follow up with a primary care doctor from the list below in 1 week for recheck. Return to the ER for changes or worsening symptoms.   Cough, Adult  A cough is a reflex that helps clear your throat and airways. It can help heal the body or may be a reaction to an irritated airway. A cough may only last 2 or 3 weeks (acute) or may last more than 8 weeks (chronic).  CAUSES Acute cough:  Viral or bacterial infections. Chronic cough:  Infections.  Allergies.  Asthma.  Post-nasal drip.  Smoking.  Heartburn or acid reflux.  Some medicines.  Chronic lung problems (COPD).  Cancer. SYMPTOMS   Cough.  Fever.  Chest pain.  Increased breathing rate.  High-pitched whistling sound when breathing (wheezing).  Colored mucus that you cough up (sputum). TREATMENT   A bacterial cough may be treated with antibiotic medicine.  A viral cough must run its course and will not respond to antibiotics.  Your caregiver may recommend other treatments if you have a chronic cough. HOME CARE INSTRUCTIONS   Only take over-the-counter or prescription medicines for pain, discomfort, or fever as directed by your caregiver. Use cough suppressants only as directed by your caregiver.  Use a cold steam vaporizer or humidifier in your bedroom or home to help loosen secretions.  Sleep  in a semi-upright position if your cough is worse at night.  Rest as needed.  Stop smoking if you smoke. SEEK IMMEDIATE MEDICAL CARE IF:   You have pus in your sputum.  Your cough starts to worsen.  You cannot control your cough with suppressants and are losing sleep.  You begin coughing up blood.  You have difficulty breathing.  You develop pain which is getting worse or is uncontrolled with medicine.  You have a fever. MAKE SURE YOU:   Understand these instructions.  Will watch your condition.  Will get help right away if you are not doing well or get worse. Document Released: 04/15/2011 Document Revised: 01/09/2012 Document Reviewed: 04/15/2011 [facility] Patient Information 2015 [place], [place]. This information is not intended to replace advice given to you by your health care provider. Make sure you discuss any questions you have with your health care provider.  Smoking Cessation, Tips for Success If you are ready to quit smoking, congratulations! You have chosen to help yourself be healthier. Cigarettes bring nicotine, tar, carbon monoxide, and other irritants into your body. Your lungs, heart, and blood vessels will be able to work better without these poisons. There are many different ways to quit smoking. Nicotine gum, nicotine patches, a nicotine inhaler, or nicotine nasal spray can help with physical craving. Hypnosis, support groups, and medicines help break the habit of smoking. WHAT THINGS CAN I DO TO MAKE QUITTING EASIER?  Here are some tips to help you quit for good:  Pick a date when you will quit smoking completely. Tell all of your friends and  family about your plan to quit on that date.  Do not try to slowly cut down on the number of cigarettes you are smoking. Pick a quit date and quit smoking completely starting on that day.  Throw away all cigarettes.   Clean and remove all ashtrays from your home, work, and car.  On a card, write down your reasons  for quitting. Carry the card with you and read it when you get the urge to smoke.  Cleanse your body of nicotine. Drink enough water and fluids to keep your urine clear or pale yellow. Do this after quitting to flush the nicotine from your body.  Learn to predict your moods. Do not let a bad situation be your excuse to have a cigarette. Some situations in your life might tempt you into wanting a cigarette.  Never have "just one" cigarette. It leads to wanting another and another. Remind yourself of your decision to quit.  Change habits associated with smoking. If you smoked while driving or when feeling stressed, try other activities to replace smoking. Stand up when drinking your coffee. Brush your teeth after eating. Sit in a different chair when you read the paper. Avoid alcohol while trying to quit, and try to drink fewer caffeinated beverages. Alcohol and caffeine may urge you to smoke.  Avoid foods and drinks that can trigger a desire to smoke, such as sugary or spicy foods and alcohol.  Ask people who smoke not to smoke around you.  Have something planned to do right after eating or having a cup of coffee. For example, plan to take a walk or exercise.  Try a relaxation exercise to calm you down and decrease your stress. Remember, you may be tense and nervous for the first 2 weeks after you quit, but this will pass.  Find new activities to keep your hands busy. Play with a pen, coin, or rubber band. Doodle or draw things on paper.  Brush your teeth right after eating. This will help cut down on the craving for the taste of tobacco after meals. You can also try mouthwash.   Use oral substitutes in place of cigarettes. Try using lemon drops, carrots, cinnamon sticks, or chewing gum. Keep them handy so they are available when you have the urge to smoke.  When you have the urge to smoke, try deep breathing.  Designate your home as a nonsmoking area.  If you are a heavy smoker, ask your  health care provider about a prescription for nicotine chewing gum. It can ease your withdrawal from nicotine.  Reward yourself. Set aside the cigarette money you save and buy yourself something nice.  Look for support from others. Join a support group or smoking cessation program. Ask someone at home or at work to help you with your plan to quit smoking.  Always ask yourself, "Do I need this cigarette or is this just a reflex?" Tell yourself, "Today, I choose not to smoke," or "I do not want to smoke." You are reminding yourself of your decision to quit.  Do not replace cigarette smoking with electronic cigarettes (commonly called e-cigarettes). The safety of e-cigarettes is unknown, and some may contain harmful chemicals.  If you relapse, do not give up! Plan ahead and think about what you will do the next time you get the urge to smoke. HOW WILL I FEEL WHEN I QUIT SMOKING? You may have symptoms of withdrawal because your body is used to nicotine (the addictive substance in cigarettes).  You may crave cigarettes, be irritable, feel very hungry, cough often, get headaches, or have difficulty concentrating. The withdrawal symptoms are only temporary. They are strongest when you first quit but will go away within 10-14 days. When withdrawal symptoms occur, stay in control. Think about your reasons for quitting. Remind yourself that these are signs that your body is healing and getting used to being without cigarettes. Remember that withdrawal symptoms are easier to treat than the major diseases that smoking can cause.  Even after the withdrawal is over, expect periodic urges to smoke. However, these cravings are generally short lived and will go away whether you smoke or not. Do not smoke! WHAT RESOURCES ARE AVAILABLE TO HELP ME QUIT SMOKING? Your health care provider can direct you to community resources or hospitals for support, which may include:  Group  support.  Education.  Hypnosis.  Therapy. Document Released: 07/15/2004 Document Revised: 03/03/2014 Document Reviewed: 04/04/2013 [facility] Patient Information 2015 [place], [place]. This information is not intended to replace advice given to you by your health care provider. Make sure you discuss any questions you have with your health care provider.  Upper Respiratory Infection, Adult An upper respiratory infection (URI) is also known as the common cold. It is often caused by a type of germ (virus). Colds are easily spread (contagious). You can pass it to others by kissing, coughing, sneezing, or drinking out of the same glass. Usually, you get better in 1 or 2 weeks.  HOME CARE   Only take medicine as told by your doctor.  Use a warm mist humidifier or breathe in steam from a hot shower.  Drink enough water and fluids to keep your pee (urine) clear or pale yellow.  Get plenty of rest.  Return to work when your temperature is back to normal or as told by your doctor. You may use a face mask and wash your hands to stop your cold from spreading. GET HELP RIGHT AWAY IF:   After the first few days, you feel you are getting worse.  You have questions about your medicine.  You have chills, shortness of breath, or brown or red spit (mucus).  You have yellow or brown snot (nasal discharge) or pain in the face, especially when you bend forward.  You have a fever, puffy (swollen) neck, pain when you swallow, or white spots in the back of your throat.  You have a bad headache, ear pain, sinus pain, or chest pain.  You have a high-pitched whistling sound when you breathe in and out (wheezing).  You have a lasting cough or cough up blood.  You have sore muscles or a stiff neck. MAKE SURE YOU:   Understand these instructions.  Will watch your condition.  Will get help right away if you are not doing well or get worse. Document Released: 04/04/2008 Document Revised: 01/09/2012  Document Reviewed: 01/22/2014 [facility] Patient Information 2015 [place], [place]. This information is not intended to replace advice given to you by your health care provider. Make sure you discuss any questions you have with your health care provider.   Emergency Department Resource Guide 1) Find a Doctor and Pay Out of Pocket Although you won't have to find out who is covered by your insurance plan, it is a good idea to ask around and get recommendations. You will then need to call the office and see if the doctor you have chosen will accept you as a new patient and what types of options they offer  for patients who are self-pay. Some doctors offer discounts or will set up payment plans for their patients who do not have insurance, but you will need to ask so you aren't surprised when you get to your appointment.  2) Contact Your Local Health Department Not all health departments have doctors that can see patients for sick visits, but many do, so it is worth a call to see if yours does. If you don't know where your local health department is, you can check in your phone book. The CDC also has a tool to help you locate your state's health department, and many state websites also have listings of all of their local health departments.  3) Find a Walk-in Clinic If your illness is not likely to be very severe or complicated, you may want to try a walk in clinic. These are popping up all over the country in pharmacies, drugstores, and shopping centers. They're usually staffed by nurse practitioners or physician assistants that have been trained to treat common illnesses and complaints. They're usually fairly quick and inexpensive. However, if you have serious medical issues or chronic medical problems, these are probably not your best option.  No Primary Care Doctor: - Call [facility] at  [phone] - they can help you locate a primary care doctor that  accepts your insurance, provides certain services,  etc. - Physician Referral Service- [phone]  Chronic Pain Problems: Organization         Address  Phone   Notes  [facility]  [phone] Patients need to be referred by their primary care doctor.   Medication Assistance: Organization         Address  Phone   Notes  [facility] Medication [facility] [address] [place] [place], [place] [address] [phone] --Must be a resident of [facility] -- Must have NO insurance coverage whatsoever (no Medicaid/ Medicare, etc.) -- The pt. MUST have a primary care doctor that directs their care regularly and follows them in the community   [facility]  [phone]   [employer]  [phone]    Agencies that provide inexpensive medical care: Organization         Address  Phone   Notes  [facility]  [phone]   [facility]    [phone]   [facility] [address] [place], [place] [address] [phone]   [facility] [address] [place], [place] [phone]   [facility]    [phone]   [facility]    [phone]   [facility]  [address], [place] Phone:  [phone], Fax:  [phone] Hours of Operation:  9 am - 6 pm, M-F.  Also accepts Medicaid/Medicare and self-pay.  [facility]  [address], [place] Phone: [phone], Fax: [phone]. Hours of Operation:  8:30 am - 5:30 pm, M-F.  Also accepts Medicaid and self-pay.  [facility] [place] [address] [place] Phone: [phone]   [facility] [address] [place]asha [address]ence [place], [place] [phone], Ext. [phone] Mondays & Thursdays: 7-9 AM.  First 15 patients are seen on a first come, first serve basis.    Medicaid-accepting [facility] Providers:  Organization         Address  Phone   Notes  [facility] [address], [place] [phone] Also accepts self-pay patients.  [facility] [address]urell Joseph[place], [place]  [phone]   [facility] [address], [place] [phone]   [facility] [address], [place] [phone]   Renaye Rakers [address], [place]   [phone] Only accepts [place] patients after they have their name applied to their card.   Self-Pay (no insurance) in [facility]:  Organization         Address  Phone   Notes  Sickle Cell Patients, [facility] [address] [place], [place] [phone]   [facility] [address] [place], [place] [phone]   [facility] [place]  [address], [place] [phone]   [facility]/Dr. Osei-Bonsu  [address] [place] or [address], [place] [phone] Phone number for both [place] and [place] locations is the same.  [facility] [address] [place] [phone]   [facility] [address], [place] or [address] [phone]   [facility] [address] [place] [phone], phone; [phone], fax Sees patients 1st and 3rd Saturday of every month.  Must not qualify for public or private insurance (i.e. Medicaid, Medicare, [place] [employer] Choice, Veterans' Benefits)  Household income should be no more than 200% of the poverty level The clinic cannot treat you if you are pregnant or think you are pregnant  Sexually transmitted diseases are not treated at the clinic.

## 2015-08-03 NOTE — ED Provider Notes (Signed)
CSN: [id number]     Arrival date & time 08/03/15  1311 History  By signing my name below, I, Marica Otter, attest that this documentation has been prepared under the direction and in the presence of Amedio Bowlby Camprubi-Soms, PA-C. Electronically Signed: Marica Otter, ED Scribe. 08/03/2015. 2:09 PM.  Chief Complaint  Patient presents with  . Cough   Patient is a 44 y.o. female presenting with cough. The history is provided by the patient. No language interpreter was used.  Cough Cough characteristics:  Non-productive and productive (initially productive last week, now dry nonproductive) Severity:  Moderate Onset quality:  Gradual Duration:  2 weeks Timing:  Intermittent Progression:  Waxing and waning (initially productive, now dry) Chronicity:  New Smoker: yes   Context: sick contacts and upper respiratory infection   Relieved by:  Nothing Worsened by:  Activity (speaking, laughing) Ineffective treatments: theraflu. Associated symptoms: wheezing   Associated symptoms: no chest pain, no chills, no ear pain, no fever, no myalgias, no rash, no rhinorrhea (improved), no shortness of breath, no sinus congestion and no sore throat   Risk factors: no recent travel    PCP: No PCP Per Patient HPI Comments: Kristin Dean is a 44 y.o. female, with PMHx noted below, including daily tobacco use (1ppd), who presents to the Emergency Department complaining of intermittent, persistent cough onset 2 weeks ago-- pt notes that she is recovering from a cold which included a productive cough, however, it has now become a dry cough; pt reports taking theraflu at home without relief; pt reports speaking and laughing aggravates her Sx. Associated Sx include mild wheezing. Her initial cold symptoms included sinus congestion/rhinorrhea as well which has resolved. Pt denies fever, chills, chest pain, new SOB (pt notes some SOB with exertion at baseline, which is unchanged), n/v/d/c, abd pain, dysuria, hematuria,  numbness/tingling, weakness, recent travels, myalgias, arthralgias, ear symptoms, or sore throat. Works around "a lot of people" but no distinct known sick contacts. No hx of asthma or COPD. No PCP at this time  Additionally pt requesting information to help find a OBGYN because she thinks she needs a mammogram and hasn't had one yet. No concerns today, just wants additional information/referrals.   Past Medical History  Diagnosis Date  . Knee dislocation   . Chronic headaches    Past Surgical History  Procedure Laterality Date  . Tubal ligation     Family History  Problem Relation Age of Onset  . Diabetes Mother   . Hypertension Father   . Breast cancer Maternal Aunt    Social History  Substance Use Topics  . Smoking status: Current Every Day Smoker -- 1.00 packs/day for 20 years  . Smokeless tobacco: None  . Alcohol Use: Yes     Comment: social   OB History    Gravida Para Term Preterm AB TAB SAB Ectopic Multiple Living   Review of Systems  Constitutional: Negative for fever and chills.  HENT: Negative for congestion (improved), ear discharge, ear pain, rhinorrhea (improved) and sore throat.   Respiratory: Positive for cough and wheezing. Negative for shortness of breath.   Cardiovascular: Negative for chest pain.  Gastrointestinal: Negative for nausea, vomiting, abdominal pain, diarrhea and constipation.  Genitourinary: Negative for dysuria and hematuria.  Musculoskeletal: Negative for myalgias and arthralgias.  Skin: Negative for rash.  Allergic/Immunologic: Negative for immunocompromised state.  Neurological: Negative for weakness and numbness.   10 Systems  reviewed and are negative for acute change except as noted in the HPI.   Allergies  Review of patient's allergies indicates no known allergies.  Home Medications   Prior to Admission medications   Medication Sig Start Date End Date Taking? Authorizing Provider  ibuprofen (ADVIL,MOTRIN)  200 MG tablet Take 200 mg by mouth every 6 (six) hours as needed for mild pain.    Historical Provider, MD  naproxen (NAPROSYN) 500 MG tablet Take 1 tablet (500 mg total) by mouth 2 (two) times daily. 06/28/14   Ivonne Andrew, PA-C   Triage Vitals: BP 152/96 mmHg  Temp(Src) 99.3 F (37.4 C) (Oral)  Resp 18  SpO2 94%  LMP 08/03/2015 Exam VS: BP 142/81 mmHg  Pulse 77  Temp(Src) 98.2 F (36.8 C) (Oral)  Resp 18  SpO2 98%  LMP 08/03/2015  Physical Exam  Constitutional: She is oriented to person, place, and time. Vital signs are normal. She appears well-developed and well-nourished.  Non-toxic appearance. No distress.  Low grade 99.49F temp in triage which resolved upon exam, nontoxic, NAD  HENT:  Head: Normocephalic and atraumatic.  Nose: Nose normal.  Mouth/Throat: Oropharynx is clear and moist and mucous membranes are normal.  Eyes: Conjunctivae and EOM are normal. Right eye exhibits no discharge. Left eye exhibits no discharge.  Neck: Normal range of motion. Neck supple.  Cardiovascular: Normal rate, regular rhythm, normal heart sounds and intact distal pulses.  Exam reveals no gallop and no friction rub.   No murmur heard. Pulmonary/Chest: Effort normal. No respiratory distress. She has no decreased breath sounds. She has no wheezes. She has rhonchi in the left lower field. She has no rales.  Dry cough noted during exam, with some slight rhonchi in left lower field, no wheezes or rales, no hypoxia or increased WOB, speaking in full sentences, SpO2 94-98% on RA  Abdominal: Soft. Normal appearance and bowel sounds are normal. She exhibits no distension. There is no tenderness. There is no rigidity, no rebound, no guarding, no CVA tenderness, no tenderness at McBurney's point and negative Murphy's sign.  Musculoskeletal: Normal range of motion.  Neurological: She is alert and oriented to person, place, and time. She has normal strength. No sensory deficit.  Skin: Skin is warm, dry and  intact. No rash noted.  Psychiatric: She has a normal mood and affect.  Nursing note and vitals reviewed.   ED Course  Procedures (including critical care time) DIAGNOSTIC STUDIES: Oxygen Saturation is 94% on ra, adequate by my interpretation.    COORDINATION OF CARE: 1:57 PM: Discussed treatment plan with pt at bedside; patient verbalizes understanding and agrees with treatment plan.  Imaging Review Dg Chest 2 View  08/03/2015   CLINICAL DATA:  Persistent cough for 2 weeks, chronic headaches  EXAM: CHEST  2 VIEW  COMPARISON:  None.  FINDINGS: The heart size and mediastinal contours are within normal limits. Both lungs are clear. The visualized skeletal structures are unremarkable.  IMPRESSION: No active cardiopulmonary disease.   Electronically Signed   By: Judie Petit.  Shick M.D.   On: 08/03/2015 14:38   I have personally reviewed and evaluated these images as part of my medical decision-making.    MDM   Final diagnoses:  Cough  Post-viral cough syndrome  Tobacco use  Bronchitis    44 y.o. female here with cough x2 wks, improving initially since it's no longer productive, but she continues to cough intensely. Some rhonchi in LLF, no wheezing. Doubt need for nebs, but will obtain CXR  since initial temp was 99.3, to r/o PNA. Discussed smoking cessation. Will reassess after CXR.   2:47 PM CXR clear. Pt afebrile upon recheck once back in room. Likely postviral tussive syndrome, but given smoking hx and duration of symptoms, will give safety script for zpack in case fever occurs or cough worsens again. Tessalon perles given. PCP list provided, f/up in 1wk. Pt also given information on [facility] clinic for her mammogram needs. OTC remedies discussed. I explained the diagnosis and have given explicit precautions to return to the ER including for any other new or worsening symptoms. The patient understands and accepts the medical plan as it's been dictated and I have answered their questions.  Discharge instructions concerning home care and prescriptions have been given. The patient is STABLE and is discharged to home in good condition.   I personally performed the services described in this documentation, which was scribed in my presence. The recorded information has been reviewed and is accurate.   BP 142/81 mmHg  Pulse 77  Temp(Src) 98.2 F (36.8 C) (Oral)  Resp 18  SpO2 98%  LMP 08/03/2015  Meds ordered this encounter  Medications  . benzonatate (TESSALON) 200 MG capsule    Sig: Take 1 capsule (200 mg total) by mouth 3 (three) times daily as needed for cough.    Dispense:  15 capsule    Refill:  0    Order Specific Question:  Supervising Provider    Answer:  MILLER, BRIAN [[id number]]  . azithromycin (ZITHROMAX Z-PAK) 250 MG tablet    Sig: 2 po day one, then 1 daily x 4 days. START TAKING IF FEVER OCCURS OR SYMPTOMS WORSEN.    Dispense:  5 tablet    Refill:  0    Order Specific Question:  Supervising Provider    Answer:  Eber Hong [[id number]]     Hyland Mollenkopf Camprubi-Soms, PA-C 08/03/15 1449  Linwood Dibbles, MD 08/07/15 0020

## 2015-08-03 NOTE — ED Notes (Signed)
Pt reports having a non productive cough since yesterday, had recent cold symptoms prior. Unable to sleep due to cough. Airway intact.

## 2015-09-01 ENCOUNTER — Other Ambulatory Visit: Payer: Self-pay | Admitting: Obstetrics and Gynecology

## 2015-09-01 DIAGNOSIS — Z1231 Encounter for screening mammogram for malignant neoplasm of breast: Secondary | ICD-10-CM

## 2015-09-10 ENCOUNTER — Encounter (HOSPITAL_COMMUNITY): Payer: Self-pay

## 2015-09-10 ENCOUNTER — Ambulatory Visit (HOSPITAL_COMMUNITY): Payer: No Typology Code available for payment source

## 2015-09-10 ENCOUNTER — Ambulatory Visit (HOSPITAL_COMMUNITY)
Admission: RE | Admit: 2015-09-10 | Discharge: 2015-09-10 | Disposition: A | Discharge status: Home / Self Care | Payer: No Typology Code available for payment source | Source: Ambulatory Visit | Attending: Obstetrics and Gynecology | Admitting: Obstetrics and Gynecology

## 2015-09-10 ENCOUNTER — Ambulatory Visit
Admission: RE | Admit: 2015-09-10 | Discharge: 2015-09-10 | Disposition: A | Discharge status: Home / Self Care | Payer: No Typology Code available for payment source | Source: Ambulatory Visit | Attending: Obstetrics and Gynecology | Admitting: Obstetrics and Gynecology

## 2015-09-10 VITALS — BP 114/80 | Temp 97.8°F | Ht 62.0 in | Wt 248.0 lb

## 2015-09-10 DIAGNOSIS — Z1231 Encounter for screening mammogram for malignant neoplasm of breast: Secondary | ICD-10-CM

## 2015-09-10 DIAGNOSIS — Z1239 Encounter for other screening for malignant neoplasm of breast: Secondary | ICD-10-CM

## 2015-09-10 NOTE — Patient Instructions (Addendum)
Educational materials on self breast awareness given. Explained to [employer] Dean that [facility] will cover Pap smears every 3 years unless has a history of abnormal Pap smears. [facility]. Appointment scheduled for Thursday, September 10, 2015 at 1000. Patient aware of appointment and will be there. Let patient know the Breast Center will follow up with her within the next couple weeks with results of mammogram by letter or phone. Discussed [facility] to follow-up for dysmenorrhea. Told patient the [facility] will call her with an appointment within the next couple of weeks and that [facility] doesn't cover. Discussed the financial assistance paperwork with patient and gave her a copy to complete. Told patient to bring completed form to her appointment. Smoking cessation discussed with patient. [facility] and gave resources to free smoking cessation classes [facility] the [facility]. Kristin Dean verbalized understanding.  Noam Franzen, Kathaleen Maserhristine Poll, RN 9:16 AM

## 2015-09-10 NOTE — Progress Notes (Addendum)
Complaints of occasional left breast pain that occurs during menstrual cycle and with certain activities. Patient rated pain at a 4 out of 10 when occurs. Patient complained of dysmenorrhea and menstrual cycles that are heavy lasting 8 days. Patient stated her cramps are so painful that OTC pain medications and heating pads don't help.   Pap Smear: Pap smear not completed today. [facility] and normal. Per patient has no history of an abnormal Pap smear. Last Pap smear result is in EPIC.  Physical exam: Breasts Breasts symmetrical. No skin abnormalities bilateral breasts. No nipple retraction bilateral breasts. No nipple discharge bilateral breasts. No lymphadenopathy. No lumps palpated bilateral breasts. No complaints of pain or tenderness on exam. [facility] for screening mamm[facility]. Appointment scheduled for Thursday, September 10, 2015 at 1000.       Pelvic/Bimanual No Pap smear completed today since last Pap smear was 10/22/2013. Pap smear not indicated per [facility] guidelines.   Smoking cessation discussed with patient. Ref[facility] the [facility] and gave resources to free smoking cessation classes at the [facility].

## 2015-09-10 NOTE — Addendum Note (Signed)
Encounter addended by: Priscille Heidelberghristine P Kolbi Altadonna, RN on: 09/10/2015 10:43 AM<BR>     Documentation filed: Patient Instructions Section, Notes Section

## 2015-09-14 ENCOUNTER — Other Ambulatory Visit: Payer: Self-pay | Admitting: Obstetrics and Gynecology

## 2015-09-14 DIAGNOSIS — R928 Other abnormal and inconclusive findings on diagnostic imaging of breast: Secondary | ICD-10-CM

## 2015-09-22 ENCOUNTER — Other Ambulatory Visit: Payer: No Typology Code available for payment source

## 2015-09-30 ENCOUNTER — Other Ambulatory Visit: Payer: No Typology Code available for payment source

## 2015-10-01 ENCOUNTER — Ambulatory Visit
Admission: RE | Admit: 2015-10-01 | Discharge: 2015-10-01 | Disposition: A | Discharge status: Home / Self Care | Payer: No Typology Code available for payment source | Source: Ambulatory Visit | Attending: Obstetrics and Gynecology | Admitting: Obstetrics and Gynecology

## 2015-10-01 DIAGNOSIS — R928 Other abnormal and inconclusive findings on diagnostic imaging of breast: Secondary | ICD-10-CM

## 2015-12-29 ENCOUNTER — Telehealth: Payer: Self-pay

## 2015-12-29 NOTE — Telephone Encounter (Signed)
Called to informed about [facility]. Left message to call back.if was interested.

## 2016-06-28 ENCOUNTER — Encounter (HOSPITAL_COMMUNITY): Payer: Self-pay | Admitting: Emergency Medicine

## 2016-06-28 ENCOUNTER — Emergency Department (HOSPITAL_COMMUNITY)
Admission: EM | Admit: 2016-06-28 | Discharge: 2016-06-28 | Disposition: A | Discharge status: Home / Self Care | Payer: No Typology Code available for payment source | Attending: Emergency Medicine | Admitting: Emergency Medicine

## 2016-06-28 DIAGNOSIS — K047 Periapical abscess without sinus: Secondary | ICD-10-CM | POA: Insufficient documentation

## 2016-06-28 DIAGNOSIS — F17200 Nicotine dependence, unspecified, uncomplicated: Secondary | ICD-10-CM | POA: Insufficient documentation

## 2016-06-28 DIAGNOSIS — K0889 Other specified disorders of teeth and supporting structures: Secondary | ICD-10-CM | POA: Insufficient documentation

## 2016-06-28 MED ORDER — IBUPROFEN 400 MG PO TABS
800.0000 mg | ORAL_TABLET | Freq: Once | ORAL | Status: AC
  Administered 2016-06-28: 800 mg via ORAL
  Filled 2016-06-28: qty 2

## 2016-06-28 MED ORDER — PENICILLIN V POTASSIUM 500 MG PO TABS: 500.0000 mg | ORAL_TABLET | Freq: Three times a day (TID) | ORAL | 0 refills | Status: DC

## 2016-06-28 NOTE — ED Provider Notes (Signed)
[facility]-EMERGENCY DEPT Provider Note   CSN: [id number] Arrival date & time: 06/28/16  1043   By signing my name below, I, Freida Busmaniana Omoyeni, attest that this documentation has been prepared under the direction and in the presence of non-physician practitioner, Dierdre ForthHannah Aariz Maish, PA-C,. Electronically Signed: Freida Busmaniana Omoyeni, Scribe. 06/28/2016. 12:21 PM.   History   Chief Complaint Chief Complaint  Patient presents with  . Dental Pain    The history is provided by the patient. No language interpreter was used.    HPI Comments:  Kristin Dean is a 45 y.o. female who presents to the Emergency Department complaining of moderate, aching, dental pain x 4 days. She reports the majority of her pain to the left lower side of her mouth.  Her pain is exacerbated when eating and when yawning. She has been taking tylenol without relief. She denies fever, and chills. She denies use of blood thinners.   Past Medical History:  Diagnosis Date  . Chronic headaches   . Knee dislocation     There are no active problems to display for this patient.   Past Surgical History:  Procedure Laterality Date  . TUBAL LIGATION      OB History    Gravida Para Term Preterm AB Living   2 2 2     2    SAB TAB Ectopic Multiple Live Births                   Home Medications    Prior to Admission medications   Medication Sig Start Date End Date Taking? Authorizing Provider  ibuprofen (ADVIL,MOTRIN) 200 MG tablet Take 200 mg by mouth every 6 (six) hours as needed for mild pain.    Historical Provider, MD  penicillin v potassium (VEETID) 500 MG tablet Take 1 tablet (500 mg total) by mouth 3 (three) times daily. 06/28/16   Dahlia ClientHannah Justine Cossin, PA-C    Family History Family History  Problem Relation Age of Onset  . Diabetes Mother   . Arthritis Mother   . Hypertension Father     Social History Social History  Substance Use Topics  . Smoking status: Current Every Day Smoker    Packs/day: 1.00   Years: 20.00  . Smokeless tobacco: Never Used  . Alcohol use Yes     Comment: social     Allergies   Review of patient's allergies indicates no known allergies.   Review of Systems Review of Systems  Constitutional: Negative for chills and fever.  HENT: Positive for dental problem. Negative for sore throat, trouble swallowing and voice change.   Respiratory: Negative for shortness of breath.   Cardiovascular: Negative for chest pain.    Physical Exam Updated Vital Signs BP 129/87 (BP Location: Right Arm)   Pulse 85   Temp 98.5 F (36.9 C) (Oral)   Resp 20   Wt 239 lb 4 oz (108.5 kg)   LMP 06/07/2016 (Approximate)   SpO2 98%   BMI 43.76 kg/m   Physical Exam  Constitutional: She appears well-developed and well-nourished.  HENT:  Head: Normocephalic.  Right Ear: Tympanic membrane, external ear and ear canal normal.  Left Ear: Tympanic membrane, external ear and ear canal normal.  Nose: Nose normal. Right sinus exhibits no maxillary sinus tenderness and no frontal sinus tenderness. Left sinus exhibits no maxillary sinus tenderness and no frontal sinus tenderness.  Mouth/Throat: Uvula is midline, oropharynx is clear and moist and mucous membranes are normal. No oral lesions. Abnormal dentition. Dental caries present.  No uvula swelling or lacerations. No oropharyngeal exudate, posterior oropharyngeal edema, posterior oropharyngeal erythema or tonsillar abscesses.  Tooth # 32 with TTP and mild gingival erythema No gross abscess No fluctuance or induration to the buccal mucosa or floor of the mouth  Eyes: Conjunctivae are normal. Pupils are equal, round, and reactive to light. Right eye exhibits no discharge. Left eye exhibits no discharge.  Neck: Normal range of motion. Neck supple.  No stridor Handling secretions without difficulty No nuchal rigidity No cervical lymphadenopathy   Cardiovascular: Normal rate, regular rhythm and normal heart sounds.   Pulmonary/Chest:  Effort normal. No respiratory distress.  Equal chest rise  Abdominal: Soft. Bowel sounds are normal. She exhibits no distension. There is no tenderness.  Lymphadenopathy:    She has no cervical adenopathy.  Neurological: She is alert.  Skin: Skin is warm and dry.  Psychiatric: She has a normal mood and affect.  Nursing note and vitals reviewed.    ED Treatments / Results  DIAGNOSTIC STUDIES:  Oxygen Saturation is 98% on RA, normal by my interpretation.    COORDINATION OF CARE:  11:46 AM Pt advised to alternate ibuprofen and tylenol for pain control.  Discussed treatment plan with pt at bedside and pt agreed to plan.  Procedures Procedures (including critical care time)  Medications Ordered in ED Medications  ibuprofen (ADVIL,MOTRIN) tablet 800 mg (800 mg Oral Given 06/28/16 1206)     Initial Impression / Assessment and Plan / ED Course  I have reviewed the triage vital signs and the nursing notes.  Pertinent labs & imaging results that were available during my care of the patient were reviewed by me and considered in my medical decision making (see chart for details).  Clinical Course  Value Comment By Time  Temp: 98.5 F (36.9 C) Afebrile, no HTN Dierdre Forth, PA-C 08/29 1148    Patient with dentalgia.  No abscess requiring immediate incision and drainage.  Exam not concerning for Ludwig's angina or pharyngeal abscess.  Will treat with penicillin. Pt instructed to follow-up with dentist.  Discussed return precautions. Pt safe for discharge.   Final Clinical Impressions(s) / ED Diagnoses   Final diagnoses:  Pain, dental  Dental infection    New Prescriptions Discharge Medication List as of 06/28/2016 11:53 AM    START taking these medications   Details  penicillin v potassium (VEETID) 500 MG tablet Take 1 tablet (500 mg total) by mouth 3 (three) times daily., Starting Tue 06/28/2016, Print        I personally performed the services described in this  documentation, which was scribed in my presence. The recorded information has been reviewed and is accurate.     Dahlia Client Cheryln Balcom, PA-C 06/28/16 1401    Jacalyn Lefevre, MD 06/29/16 [phone]

## 2016-06-28 NOTE — ED Triage Notes (Addendum)
Pt c/o bilateral lower dental pain. States "I put my finger in there and something moved". States had to stay out of work today so needs work note.

## 2016-06-28 NOTE — Discharge Instructions (Signed)
1. Medications: alternate 800mg  of ibuprofen and 1g of Acetaminophen (do not exceed 4g in 24 hours) every 4 hours for pain control; penicillin, usual home medications 2. Treatment: rest, drink plenty of fluids, take medications as prescribed 3. Follow Up: Please followup with dentistry within 1 week for discussion of your diagnoses and further evaluation after today's visit; if you do not have a primary care doctor use the resource guide provided to find one; Return to the ER for high fevers, difficulty breathing, difficulty swallowing or other concerning symptoms

## 2016-06-30 ENCOUNTER — Encounter (HOSPITAL_BASED_OUTPATIENT_CLINIC_OR_DEPARTMENT_OTHER): Payer: Self-pay | Admitting: Emergency Medicine

## 2016-06-30 ENCOUNTER — Emergency Department (HOSPITAL_BASED_OUTPATIENT_CLINIC_OR_DEPARTMENT_OTHER)
Admission: EM | Admit: 2016-06-30 | Discharge: 2016-06-30 | Disposition: A | Discharge status: Home / Self Care | Payer: No Typology Code available for payment source | Attending: Emergency Medicine | Admitting: Emergency Medicine

## 2016-06-30 DIAGNOSIS — H9209 Otalgia, unspecified ear: Secondary | ICD-10-CM | POA: Insufficient documentation

## 2016-06-30 DIAGNOSIS — F17200 Nicotine dependence, unspecified, uncomplicated: Secondary | ICD-10-CM | POA: Insufficient documentation

## 2016-06-30 DIAGNOSIS — K0889 Other specified disorders of teeth and supporting structures: Secondary | ICD-10-CM

## 2016-06-30 DIAGNOSIS — R0981 Nasal congestion: Secondary | ICD-10-CM | POA: Insufficient documentation

## 2016-06-30 DIAGNOSIS — R509 Fever, unspecified: Secondary | ICD-10-CM | POA: Insufficient documentation

## 2016-06-30 MED ORDER — BENZOCAINE 10 % MT GEL: 1.0000 "application " | Freq: Four times a day (QID) | OROMUCOSAL | 0 refills | Status: DC | PRN

## 2016-06-30 MED ORDER — LIDOCAINE VISCOUS 2 % MT SOLN
15.0000 mL | Freq: Once | OROMUCOSAL | Status: AC
  Administered 2016-06-30: 15 mL via OROMUCOSAL
  Filled 2016-06-30: qty 15

## 2016-06-30 NOTE — ED Provider Notes (Signed)
[facility]   CSN: [id number] Arrival date & time: 06/30/16  9562     History   Chief Complaint Chief Complaint  Patient presents with  . Dental Pain    HPI Kristin Dean is a 45 y.o. female.  Kristin Dean is a 45 y.o. female with history of headaches presents to ED with complaint of dental pain. Right lower dental pain started on Saturday. She denies any trauma. She was seen in the ED on Tuesday and prescribed penicillin, instructed on symptomatic management, and encouraged to follow up with dentist. Patient reports to ED today with continued pain and states she does not have dental insurance or the money to see a dentist. She has tried naproxen, ibuprofen, and tylenol with minimal relief. Patient endorses some nasal congestion that has resolved and ear pain. Patient denies fever, neck pain, trouble swallowing, eye discharge, chest pain, or shortness of breath.     Past Medical History:  Diagnosis Date  . Chronic headaches   . Knee dislocation     There are no active problems to display for this patient.   Past Surgical History:  Procedure Laterality Date  . TUBAL LIGATION      OB History    Gravida Para Term Preterm AB Living   2 2 2     2    SAB TAB Ectopic Multiple Live Births                   Home Medications    Prior to Admission medications   Medication Sig Start Date End Date Taking? Authorizing Provider  benzocaine (ORAJEL) 10 % mucosal gel Use as directed 1 application in the mouth or throat 4 (four) times daily as needed for mouth pain. Apply to affected tooth up to 4 times daily 06/30/16   Lona Kettle, PA-C  ibuprofen (ADVIL,MOTRIN) 200 MG tablet Take 200 mg by mouth every 6 (six) hours as needed for mild pain.    Historical Provider, MD  penicillin v potassium (VEETID) 500 MG tablet Take 1 tablet (500 mg total) by mouth 3 (three) times daily. 06/28/16   Dahlia Client Muthersbaugh, PA-C    Family History Family  History  Problem Relation Age of Onset  . Diabetes Mother   . Arthritis Mother   . Hypertension Father     Social History Social History  Substance Use Topics  . Smoking status: Current Every Day Smoker    Packs/day: 1.00    Years: 20.00  . Smokeless tobacco: Never Used  . Alcohol use Yes     Comment: social     Allergies   Review of patient's allergies indicates no known allergies.   Review of Systems Review of Systems  Constitutional: Positive for fever.  HENT: Positive for congestion, dental problem and ear pain. Negative for trouble swallowing.   Eyes: Negative for discharge.  Respiratory: Negative for shortness of breath.   Cardiovascular: Negative for chest pain.  Musculoskeletal: Negative for neck pain.     Physical Exam Updated Vital Signs BP 134/81 (BP Location: Left Arm)   Pulse 71   Temp 98.4 F (36.9 C) (Oral)   Resp 18   Ht 5\' 1"  (1.549 m)   Wt 108.9 kg   LMP 06/07/2016 (Approximate)   SpO2 96%   BMI 45.35 kg/m   Physical Exam  Constitutional: She appears well-developed and well-nourished. No distress.  HENT:  Head: Normocephalic and atraumatic.  Nose: Right sinus exhibits maxillary sinus tenderness.  Right sinus exhibits no frontal sinus tenderness. Left sinus exhibits maxillary sinus tenderness. Left sinus exhibits no frontal sinus tenderness.  Mouth/Throat: Uvula is midline, oropharynx is clear and moist and mucous membranes are normal. No trismus in the jaw. Abnormal dentition. Dental caries present. No dental abscesses. No oropharyngeal exudate, posterior oropharyngeal edema, posterior oropharyngeal erythema or tonsillar abscesses. No tonsillar exudate.  Cerumen noted in both ear canals. Of visualized TM, no erythema noted. Mild nasal congestion noted. No trismus. Uvula is midline. Dental caries noted. TTP of #32. No avulsion appreciated. No surrounding erythema or area of fluctuance. No obvious drainage. No TTP or swelling of sublingual region.    Eyes: Conjunctivae and EOM are normal. Pupils are equal, round, and reactive to light. Right eye exhibits no discharge. Left eye exhibits no discharge. No scleral icterus.  Neck: Trachea normal, normal range of motion and phonation normal. Neck supple. No tracheal tenderness and no spinous process tenderness present. No neck rigidity. Normal range of motion present.  No nuchal rigidity. No stridor.   Cardiovascular: Normal rate and intact distal pulses.   Pulmonary/Chest: Effort normal. No stridor. No respiratory distress.  Abdominal: She exhibits no distension.  Lymphadenopathy:    She has no cervical adenopathy.  Neurological: She is alert.  Skin: Skin is warm and dry. She is not diaphoretic.  Psychiatric: She has a normal mood and affect. Her behavior is normal.     ED Treatments / Results  Labs (all labs ordered are listed, but only abnormal results are displayed) Labs Reviewed - No data to display  EKG  EKG Interpretation None       Radiology No results found.  Procedures Procedures (including critical care time)  Medications Ordered in ED Medications  lidocaine (XYLOCAINE) 2 % viscous mouth solution 15 mL (15 mLs Mouth/Throat Given 06/30/16 0959)     Initial Impression / Assessment and Plan / ED Course  I have reviewed the triage vital signs and the nursing notes.  Pertinent labs & imaging results that were available during my care of the patient were reviewed by me and considered in my medical decision making (see chart for details).  Clinical Course    Patient with dentalgia.  Patient is afebrile and non-toxic appearing in NAD. VSS. No abscess requiring immediate incision and drainage.  Exam not concerning for Ludwig's angina or pharyngeal abscess.  Patient currently on PCN for prophylaxis against infection. Pt scheduled to see dentist on Tuesday, September 5th. Symptomtatic management discussed to include ibuprofen and tylenol, ice, and orajel. Discussed return  precautions. Pt safe for discharge. Patient voiced understanding and is agreeable.   Final Clinical Impressions(s) / ED Diagnoses   Final diagnoses:  Pain, dental    New Prescriptions Discharge Medication List as of 06/30/2016  9:53 AM    START taking these medications   Details  benzocaine (ORAJEL) 10 % mucosal gel Use as directed 1 application in the mouth or throat 4 (four) times daily as needed for mouth pain. Apply to affected tooth up to 4 times daily, Starting Thu 06/30/2016, Print         Lona Kettleshley Laurel Paulette [place], [place]-C 06/30/16 1023    Charlynne Panderavid Hsienta Yao, MD 06/30/16 [phone]

## 2016-06-30 NOTE — Discharge Instructions (Signed)
Read the information below.  Continue the penicillin as prescribed.  Take tylenol 650mg  every 6hrs or ibuprofen 400mg  every 6 hrs. Apply ice for 20 minute increments. I am prescribing a benozcaine gel, apply to affected tooth up to four times daily.  You are scheduled to see Dr. Michiel SitesKoelling on Tuesday, [place]  Use the prescribed medication as directed.  Please discuss all new medications with your pharmacist.   You may return to the Emergency Department at any time for worsening condition or any new symptoms that concern you. Return to ED if you develop fever, difficulty swallowing, difficulty breathing, or neck pain.

## 2016-06-30 NOTE — ED Triage Notes (Signed)
R lower dental pain, possible broken tooth. Pt is on PCN for same.

## 2016-07-15 LAB — GLUCOSE, POCT (MANUAL RESULT ENTRY): POC Glucose: 86 mg/dl (ref 70–99)

## 2016-08-01 ENCOUNTER — Emergency Department (HOSPITAL_COMMUNITY)
Admission: EM | Admit: 2016-08-01 | Discharge: 2016-08-01 | Disposition: A | Discharge status: Home / Self Care | Payer: No Typology Code available for payment source | Attending: Emergency Medicine | Admitting: Emergency Medicine

## 2016-08-01 ENCOUNTER — Encounter (HOSPITAL_COMMUNITY): Payer: Self-pay | Admitting: Emergency Medicine

## 2016-08-01 DIAGNOSIS — L03116 Cellulitis of left lower limb: Secondary | ICD-10-CM | POA: Insufficient documentation

## 2016-08-01 DIAGNOSIS — F17200 Nicotine dependence, unspecified, uncomplicated: Secondary | ICD-10-CM | POA: Insufficient documentation

## 2016-08-01 MED ORDER — CEPHALEXIN 500 MG PO CAPS: 500.0000 mg | ORAL_CAPSULE | Freq: Four times a day (QID) | ORAL | 0 refills | Status: AC

## 2016-08-01 MED ORDER — IBUPROFEN 800 MG PO TABS: 800.0000 mg | ORAL_TABLET | Freq: Three times a day (TID) | ORAL | 0 refills | Status: DC

## 2016-08-01 MED ORDER — HYDROCODONE-ACETAMINOPHEN 5-325 MG PO TABS
1.0000 | ORAL_TABLET | Freq: Once | ORAL | Status: AC
  Administered 2016-08-01: 1 via ORAL
  Filled 2016-08-01: qty 1

## 2016-08-01 MED ORDER — CEPHALEXIN 250 MG PO CAPS
500.0000 mg | ORAL_CAPSULE | Freq: Once | ORAL | Status: AC
  Administered 2016-08-01: 500 mg via ORAL
  Filled 2016-08-01: qty 2

## 2016-08-01 MED ORDER — HYDROCODONE-ACETAMINOPHEN 5-325 MG PO TABS: 1.0000 | ORAL_TABLET | ORAL | 0 refills | Status: DC | PRN

## 2016-08-01 NOTE — ED Provider Notes (Signed)
[facility] Note   CSN: [id number] Arrival date & time: 08/01/16  0115     History   Chief Complaint Chief Complaint  Patient presents with  . Insect Bite    HPI Kristin Dean is a 45 y.o. female.  Patient complains of left foot pain and swelling without known injury. She is concerned that she might have been bitten by something. No fever, drainage.   The history is provided by the patient. No language interpreter was used.    Past Medical History:  Diagnosis Date  . Chronic headaches   . Knee dislocation     There are no active problems to display for this patient.   Past Surgical History:  Procedure Laterality Date  . TUBAL LIGATION      OB History    Gravida Para Term Preterm AB Living   2 2 2     2    SAB TAB Ectopic Multiple Live Births                   Home Medications    Prior to Admission medications   Medication Sig Start Date End Date Taking? Authorizing Provider  benzocaine (ORAJEL) 10 % mucosal gel Use as directed 1 application in the mouth or throat 4 (four) times daily as needed for mouth pain. Apply to affected tooth up to 4 times daily 06/30/16   Lona Kettle, PA-C  cephALEXin (KEFLEX) 500 MG capsule Take 1 capsule (500 mg total) by mouth 4 (four) times daily. 08/01/16 08/11/16  Elpidio Anis, PA-C  HYDROcodone-acetaminophen (NORCO/VICODIN) 5-325 MG tablet Take 1 tablet by mouth every 4 (four) hours as needed. 08/01/16   Elpidio Anis, PA-C  ibuprofen (ADVIL,MOTRIN) 200 MG tablet Take 200 mg by mouth every 6 (six) hours as needed for mild pain.    Historical Provider, MD  ibuprofen (ADVIL,MOTRIN) 800 MG tablet Take 1 tablet (800 mg total) by mouth 3 (three) times daily. 08/01/16   Elpidio Anis, PA-C  penicillin v potassium (VEETID) 500 MG tablet Take 1 tablet (500 mg total) by mouth 3 (three) times daily. 06/28/16   Dahlia Client Muthersbaugh, PA-C    Family History Family History  Problem Relation Age of Onset  .  Diabetes Mother   . Arthritis Mother   . Hypertension Father     Social History Social History  Substance Use Topics  . Smoking status: Current Every Day Smoker    Packs/day: 0.00    Years: 0.00  . Smokeless tobacco: Never Used  . Alcohol use Yes     Allergies   Review of patient's allergies indicates no known allergies.   Review of Systems Review of Systems  Constitutional: Negative for fever.  Musculoskeletal:       See HPI.  Skin: Positive for color change.  Neurological: Negative for numbness.     Physical Exam Updated Vital Signs BP 130/79   Pulse 80   Temp 98.9 F (37.2 C) (Oral)   Resp 18   Ht 5\' 2"  (1.575 m)   Wt 242 lb (109.8 kg)   LMP 06/27/2016   SpO2 100%   BMI 44.26 kg/m   Physical Exam  Constitutional: She is oriented to person, place, and time. She appears well-developed and well-nourished.  Neck: Normal range of motion.  Pulmonary/Chest: Effort normal.  Musculoskeletal:  Left foot has FROM all joints, including ankle. No nail thickening, cuticle swelling or pus collection. No intraphalangeal rash, drainage or redness.   Neurological: She is alert  and oriented to person, place, and time.  Skin: Skin is warm and dry.  Dorsal left foot swollen over 2nd-4th MTP joints to central mid-forefoot. No induration. No evidence to suggest bite wound.      ED Treatments / Results  Labs (all labs ordered are listed, but only abnormal results are displayed) Labs Reviewed - No data to display  EKG  EKG Interpretation None       Radiology No results found.  Procedures Procedures (including critical care time)  Medications Ordered in ED Medications  cephALEXin (KEFLEX) capsule 500 mg (500 mg Oral Given 08/01/16 0248)  HYDROcodone-acetaminophen (NORCO/VICODIN) 5-325 MG per tablet 1 tablet (1 tablet Oral Given 08/01/16 0249)     Initial Impression / Assessment and Plan / ED Course  I have reviewed the triage vital signs and the nursing  notes.  Pertinent labs & imaging results that were available during my care of the patient were reviewed by me and considered in my medical decision making (see chart for details).  Clinical Course    Findings of swelling, tenderness and mild redness to dorsal left foot suggestive of mild/early cellulitis. No evidence of bite. No history of DM. Will treat with Keflex and provide pain relief, including ibuprofen. PCP follow up recommended.   Final Clinical Impressions(s) / ED Diagnoses   Final diagnoses:  Cellulitis of left lower extremity  1. Left foot cellulitis   New Prescriptions Discharge Medication List as of 08/01/2016  3:01 AM    START taking these medications   Details  cephALEXin (KEFLEX) 500 MG capsule Take 1 capsule (500 mg total) by mouth 4 (four) times daily., Starting Mon 08/01/2016, Until Thu 08/11/2016, Print    HYDROcodone-acetaminophen (NORCO/VICODIN) 5-325 MG tablet Take 1 tablet by mouth every 4 (four) hours as needed., Starting Mon 08/01/2016, Print    !! ibuprofen (ADVIL,MOTRIN) 800 MG tablet Take 1 tablet (800 mg total) by mouth 3 (three) times daily., Starting Mon 08/01/2016, Print     !! - Potential duplicate medications found. Please discuss with provider.       Elpidio AnisShari Upstill, PA-C 08/01/16 0259    Tilden FossaElizabeth Saleemah Mollenhauer, MD 08/01/16 [phone]

## 2016-08-01 NOTE — ED Notes (Signed)
Patient Alert and oriented X4. Stable and ambulatory. Patient verbalized understanding of the discharge instructions.  Patient belongings were taken by the patient.  

## 2016-08-01 NOTE — ED Triage Notes (Signed)
Pt. reports left foot insect bite with pain and swelling Sunday morning . Denies fever / ambulatory.

## 2016-08-01 NOTE — ED Notes (Signed)
Patient is A&Ox4 at this time.  Patient in no signs of distress.  Please see providers note for complete history and physical exam.  

## 2017-09-28 ENCOUNTER — Ambulatory Visit (INDEPENDENT_AMBULATORY_CARE_PROVIDER_SITE_OTHER): Payer: Self-pay

## 2017-09-28 ENCOUNTER — Encounter (HOSPITAL_COMMUNITY): Payer: Self-pay | Admitting: Emergency Medicine

## 2017-09-28 ENCOUNTER — Other Ambulatory Visit: Payer: Self-pay

## 2017-09-28 ENCOUNTER — Ambulatory Visit (HOSPITAL_COMMUNITY)
Admission: EM | Admit: 2017-09-28 | Discharge: 2017-09-28 | Disposition: A | Discharge status: Home / Self Care | Payer: Self-pay

## 2017-09-28 DIAGNOSIS — M25512 Pain in left shoulder: Secondary | ICD-10-CM

## 2017-09-28 DIAGNOSIS — S161XXA Strain of muscle, fascia and tendon at neck level, initial encounter: Secondary | ICD-10-CM

## 2017-09-28 DIAGNOSIS — S39012A Strain of muscle, fascia and tendon of lower back, initial encounter: Secondary | ICD-10-CM

## 2017-09-28 HISTORY — DX: Essential (primary) hypertension: I10

## 2017-09-28 MED ORDER — METHOCARBAMOL 500 MG PO TABS
500.0000 mg | ORAL_TABLET | Freq: Four times a day (QID) | ORAL | 0 refills | Status: DC | PRN
Start: 2017-09-28 — End: 2024-02-02

## 2017-09-28 MED ORDER — KETOROLAC TROMETHAMINE 30 MG/ML IJ SOLN
30.0000 mg | Freq: Once | INTRAMUSCULAR | Status: AC
  Administered 2017-09-28: 30 mg via INTRAMUSCULAR

## 2017-09-28 MED ORDER — NAPROXEN 500 MG PO TABS
500.0000 mg | ORAL_TABLET | Freq: Two times a day (BID) | ORAL | 0 refills | Status: DC
Start: 2017-09-28 — End: 2024-02-02

## 2017-09-28 MED ORDER — KETOROLAC TROMETHAMINE 30 MG/ML IJ SOLN
INTRAMUSCULAR | Status: AC
  Filled 2017-09-28: qty 1
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
## 2017-09-28 NOTE — ED Notes (Signed)
Patient transported to X-ray 

## 2017-09-28 NOTE — ED Notes (Signed)
Pt ambulated into room without issue.
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
## 2017-10-16 ENCOUNTER — Encounter (HOSPITAL_COMMUNITY): Payer: Self-pay

## 2017-11-30 ENCOUNTER — Other Ambulatory Visit: Payer: Self-pay | Admitting: *Deleted

## 2017-11-30 DIAGNOSIS — Z1231 Encounter for screening mammogram for malignant neoplasm of breast: Secondary | ICD-10-CM

## 2018-09-17 DIAGNOSIS — Z111 Encounter for screening for respiratory tuberculosis: Secondary | ICD-10-CM

## 2018-10-04 LAB — TB SKIN TEST
Induration: 0 mm
TB SKIN TEST: NEGATIVE

## 2018-10-04 NOTE — Congregational Nurse Program (Signed)
TB skin test read.  Negative results

## 2018-10-04 NOTE — Congregational Nurse Program (Signed)
TB skin test given right forarm  Client instructed to return on Wednesday for reading

## 2020-10-12 ENCOUNTER — Emergency Department (HOSPITAL_COMMUNITY)
Admission: EM | Admit: 2020-10-12 | Discharge: 2020-10-12 | Disposition: A | Discharge status: Home / Self Care | Payer: Self-pay | Attending: Emergency Medicine | Admitting: Emergency Medicine

## 2020-10-12 ENCOUNTER — Other Ambulatory Visit: Payer: Self-pay

## 2020-10-12 DIAGNOSIS — K0889 Other specified disorders of teeth and supporting structures: Secondary | ICD-10-CM | POA: Insufficient documentation

## 2020-10-12 DIAGNOSIS — I10 Essential (primary) hypertension: Secondary | ICD-10-CM | POA: Insufficient documentation

## 2020-10-12 DIAGNOSIS — Z79899 Other long term (current) drug therapy: Secondary | ICD-10-CM | POA: Insufficient documentation

## 2020-10-12 DIAGNOSIS — F17200 Nicotine dependence, unspecified, uncomplicated: Secondary | ICD-10-CM | POA: Insufficient documentation

## 2020-10-12 MED ORDER — CHLORHEXIDINE GLUCONATE 0.12% ORAL RINSE (MEDLINE KIT)
15.0000 mL | Freq: Once | OROMUCOSAL | Status: AC
  Administered 2020-10-12: 12:00:00 15 mL via OROMUCOSAL

## 2020-10-12 MED ORDER — CHLORHEXIDINE GLUCONATE 0.12 % MT SOLN: 15.0000 mL | Freq: Two times a day (BID) | OROMUCOSAL | 0 refills | Status: DC

## 2020-10-12 MED ORDER — OXYCODONE-ACETAMINOPHEN 5-325 MG PO TABS: 1.0000 | ORAL_TABLET | ORAL | 0 refills | Status: AC | PRN

## 2020-10-12 MED ORDER — OXYCODONE-ACETAMINOPHEN 5-325 MG PO TABS
1.0000 | ORAL_TABLET | Freq: Once | ORAL | Status: AC
  Administered 2020-10-12: 12:00:00 1 via ORAL
  Filled 2020-10-12: qty 1

## 2020-10-12 MED ORDER — PENICILLIN V POTASSIUM 500 MG PO TABS: 500.0000 mg | ORAL_TABLET | Freq: Four times a day (QID) | ORAL | 0 refills | Status: AC

## 2020-10-12 NOTE — ED Triage Notes (Signed)
Pt reports loose tooth on L side of mouth that is causing pain in jaw and cheek x 1 month.

## 2020-10-12 NOTE — ED Provider Notes (Signed)
[facility] EMERGENCY DEPARTMENT Provider Note   CSN: [id number] Arrival date & time: 10/12/20  3559     History Chief Complaint  Patient presents with  . Dental Pain    Kristin Dean is a 49 y.o. female.  HPI 50 year-old female with history of hypertension, chronic headaches, knee dislocation presents to the ER with complaints of dental pain.  Patient states that she has been having ongoing problems with a tooth on the left lower side of her mouth for approximately a month.  States that it is still painful now that she has been able to eat.  Denies any inability to swallow, no fevers or chills.  Has not noted any cheek or neck swelling.  She is not insured and does not have a dentist.  States that the tooth that is causing her problems is loose.  Requesting that we pull it.  Has not taken anything for her symptoms.    Past Medical History:  Diagnosis Date  . Chronic headaches   . Hypertension   . Knee dislocation     There are no problems to display for this patient.   Past Surgical History:  Procedure Laterality Date  . TUBAL LIGATION       OB History    Gravida  2   Para  2   Term  2   Preterm      AB      Living  2     SAB      IAB      Ectopic      Multiple      Live Births              Family History  Problem Relation Age of Onset  . Diabetes Mother   . Arthritis Mother   . Hypertension Father     Social History   Tobacco Use  . Smoking status: Current Every Day Smoker    Packs/day: 0.00    Years: 0.00    Pack years: 0.00  . Smokeless tobacco: Never Used  Substance Use Topics  . Alcohol use: Yes  . Drug use: Yes    Types: Marijuana    Comment: rarely    Home Medications Prior to Admission medications   Medication Sig Start Date End Date Taking? Authorizing Provider  benzocaine (ORAJEL) 10 % mucosal gel Use as directed 1 application in the mouth or throat 4 (four) times daily as needed for mouth  pain. Apply to affected tooth up to 4 times daily 06/30/16   Gay Filler L, PA-C  chlorhexidine (PERIDEX) 0.12 % solution Use as directed 15 mLs in the mouth or throat 2 (two) times daily. 10/12/20   Garald Balding, PA-C  HYDROcodone-acetaminophen (NORCO/VICODIN) 5-325 MG tablet Take 1 tablet by mouth every 4 (four) hours as needed. 08/01/16   Charlann Lange, PA-C  ibuprofen (ADVIL,MOTRIN) 200 MG tablet Take 200 mg by mouth every 6 (six) hours as needed for mild pain.    [provider]  ibuprofen (ADVIL,MOTRIN) 800 MG tablet Take 1 tablet (800 mg total) by mouth 3 (three) times daily. 08/01/16   Charlann Lange, PA-C  LOSARTAN POTASSIUM-HCTZ PO Take by mouth.    [provider]  methocarbamol (ROBAXIN) 500 MG tablet Take 1 tablet (500 mg total) by mouth every 6 (six) hours as needed for muscle spasms. 09/28/17   Blue, Olivia C, PA-C  naproxen (NAPROSYN) 500 MG tablet Take 1 tablet (500 mg total) by  mouth 2 (two) times daily. 09/28/17   Blue, Olivia C, PA-C  oxyCODONE-acetaminophen (PERCOCET/ROXICET) 5-325 MG tablet Take 1 tablet by mouth every 4 (four) hours as needed for up to 3 days for severe pain. 10/12/20 10/15/20  Sharyn Lull A, PA-C  penicillin v potassium (VEETID) 500 MG tablet Take 1 tablet (500 mg total) by mouth 4 (four) times daily for 7 days. 10/12/20 10/19/20  Garald Balding, PA-C    Allergies    Patient has no known allergies.  Review of Systems   Review of Systems  Constitutional: Negative for chills and fever.  HENT: Positive for dental problem. Negative for drooling, sore throat, trouble swallowing and voice change.     Physical Exam Updated Vital Signs BP (!) 162/93 (BP Location: Left Arm)   Pulse 62   Temp 98.9 F (37.2 C) (Oral)   Resp 16   SpO2 97%   Physical Exam Vitals reviewed.  Constitutional:      Appearance: Normal appearance. She is obese.  HENT:     Head: Normocephalic and atraumatic.     Mouth/Throat:      Comments: Poor  dentition throughout oral cavity.  Multiple missing teeth in upper and lower mouth. Tooth #18 loose.  No pain to the gumline, no excessive erythema or fluctuance.  No significant fluctuance or redness to the inner cheek.  Oropharynx non erythematous without exudates, uvula midline, no unilateral tonsillar swelling, tongue normal size and midline, no sublingual/submandibular swellimg, tolerating secretions well    Eyes:     General:        Right eye: No discharge.        Left eye: No discharge.     Extraocular Movements: Extraocular movements intact.     Conjunctiva/sclera: Conjunctivae normal.  Musculoskeletal:        General: No swelling. Normal range of motion.  Neurological:     General: No focal deficit present.     Mental Status: She is alert and oriented to person, place, and time.  Psychiatric:        Mood and Affect: Mood normal.        Behavior: Behavior normal.      ED Results / Procedures / Treatments   Labs (all labs ordered are listed, but only abnormal results are displayed) Labs Reviewed - No data to display  EKG None  Radiology No results found.  Procedures Procedures (including critical care time)  Medications Ordered in ED Medications  oxyCODONE-acetaminophen (PERCOCET/ROXICET) 5-325 MG per tablet 1 tablet (has no administration in time range)  chlorhexidine gluconate (MEDLINE KIT) (PERIDEX) 0.12 % solution 15 mL (has no administration in time range)    ED Course  I have reviewed the triage vital signs and the nursing notes.  Pertinent labs & imaging results that were available during my care of the patient were reviewed by me and considered in my medical decision making (see chart for details).    MDM Rules/Calculators/A&P                          Patient with toothache.  No gross abscess.  Exam unconcerning for Ludwig's angina or spread of infection.  Will treat with penicillin and short course of Percocet. PDMP reviewed and appropriate.   Also  provided Peridex mouthwash.  Urged patient to follow-up with dentist.   Provided info for local dentist and dental school in [place].  Return precautions discussed.  She voiced understanding and is  agreeable.  Final Clinical Impression(s) / ED Diagnoses Final diagnoses:  Pain, dental    Rx / DC Orders ED Discharge Orders         Ordered    penicillin v potassium (VEETID) 500 MG tablet  4 times daily        10/12/20 1130    chlorhexidine (PERIDEX) 0.12 % solution  2 times daily        10/12/20 1130    oxyCODONE-acetaminophen (PERCOCET/ROXICET) 5-325 MG tablet  Every 4 hours PRN        10/12/20 1140           Lyndel Safe 10/12/20 1142    Breck Coons, MD 10/12/20 1343

## 2020-10-12 NOTE — Discharge Instructions (Signed)
Please take the antibiotic as directed until finished.  Please use the short course of pain medicine as needed for pain.  You may also use Tylenol or ibuprofen for pain.  You may use the Peridex mouthwash twice daily.  I have provided formation for the dentist here in [place] and also the school of dentistry at [facility].  Please call them and try to schedule an appointment.  Return to the ER if you have worsening swelling, pain, inability to swallow, drooling etc.

## 2022-01-16 ENCOUNTER — Emergency Department (HOSPITAL_BASED_OUTPATIENT_CLINIC_OR_DEPARTMENT_OTHER): Payer: Self-pay | Admitting: Radiology

## 2022-01-16 ENCOUNTER — Other Ambulatory Visit: Payer: Self-pay

## 2022-01-16 ENCOUNTER — Emergency Department (HOSPITAL_BASED_OUTPATIENT_CLINIC_OR_DEPARTMENT_OTHER)
Admission: EM | Admit: 2022-01-16 | Discharge: 2022-01-16 | Disposition: A | Discharge status: Home / Self Care | Payer: Self-pay | Attending: Emergency Medicine | Admitting: Emergency Medicine

## 2022-01-16 ENCOUNTER — Encounter (HOSPITAL_BASED_OUTPATIENT_CLINIC_OR_DEPARTMENT_OTHER): Payer: Self-pay | Admitting: Emergency Medicine

## 2022-01-16 DIAGNOSIS — S4992XA Unspecified injury of left shoulder and upper arm, initial encounter: Secondary | ICD-10-CM | POA: Insufficient documentation

## 2022-01-16 DIAGNOSIS — S79912A Unspecified injury of left hip, initial encounter: Secondary | ICD-10-CM | POA: Insufficient documentation

## 2022-01-16 DIAGNOSIS — Y92410 Unspecified street and highway as the place of occurrence of the external cause: Secondary | ICD-10-CM | POA: Diagnosis not present

## 2022-01-16 DIAGNOSIS — I10 Essential (primary) hypertension: Secondary | ICD-10-CM | POA: Insufficient documentation

## 2022-01-16 DIAGNOSIS — S29001A Unspecified injury of muscle and tendon of front wall of thorax, initial encounter: Secondary | ICD-10-CM | POA: Insufficient documentation

## 2022-01-16 DIAGNOSIS — S3992XA Unspecified injury of lower back, initial encounter: Secondary | ICD-10-CM | POA: Insufficient documentation

## 2022-01-16 MED ORDER — OXYCODONE HCL 5 MG PO TABS: 5.0000 mg | ORAL_TABLET | ORAL | 0 refills | Status: DC | PRN

## 2022-01-16 MED ORDER — METHOCARBAMOL 500 MG PO TABS: 1000.0000 mg | ORAL_TABLET | Freq: Two times a day (BID) | ORAL | 0 refills | Status: DC

## 2022-01-16 MED ORDER — LIDOCAINE 5 % EX PTCH
1.0000 | MEDICATED_PATCH | CUTANEOUS | Status: DC
  Administered 2022-01-16: 1 via TRANSDERMAL
  Filled 2022-01-16: qty 1

## 2022-01-16 MED ORDER — IBUPROFEN 800 MG PO TABS
800.0000 mg | ORAL_TABLET | Freq: Once | ORAL | Status: AC
  Administered 2022-01-16: 800 mg via ORAL
  Filled 2022-01-16: qty 1

## 2022-01-16 MED ORDER — LIDOCAINE 5 % EX PTCH: 1.0000 | MEDICATED_PATCH | Freq: Every day | CUTANEOUS | 0 refills | Status: DC | PRN

## 2022-01-16 MED ORDER — HYDROCODONE-ACETAMINOPHEN 5-325 MG PO TABS
1.0000 | ORAL_TABLET | Freq: Once | ORAL | Status: AC
  Administered 2022-01-16: 1 via ORAL
  Filled 2022-01-16: qty 1

## 2022-01-16 MED ORDER — METHOCARBAMOL 500 MG PO TABS: 1000.0000 mg | ORAL_TABLET | Freq: Two times a day (BID) | ORAL | 0 refills | Status: AC
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
## 2022-01-16 NOTE — Discharge Instructions (Signed)
It was a pleasure caring for you today in the emergency department. ? ?You may take OTC Motrin or Tylenol as needed for discomfort ? ?Please return to the emergency department for any worsening or worrisome symptoms. ? ?

## 2022-04-15 ENCOUNTER — Other Ambulatory Visit: Payer: Self-pay | Admitting: Family Medicine

## 2022-04-15 DIAGNOSIS — Z1231 Encounter for screening mammogram for malignant neoplasm of breast: Secondary | ICD-10-CM

## 2022-04-15 DIAGNOSIS — F17210 Nicotine dependence, cigarettes, uncomplicated: Secondary | ICD-10-CM

## 2022-04-26 ENCOUNTER — Ambulatory Visit
Admission: RE | Admit: 2022-04-26 | Discharge: 2022-04-26 | Disposition: A | Discharge status: Home / Self Care | Payer: Self-pay | Source: Ambulatory Visit | Attending: Family Medicine | Admitting: Family Medicine

## 2022-04-26 ENCOUNTER — Ambulatory Visit: Payer: Self-pay

## 2022-04-26 DIAGNOSIS — Z1231 Encounter for screening mammogram for malignant neoplasm of breast: Secondary | ICD-10-CM

## 2022-05-13 ENCOUNTER — Other Ambulatory Visit: Payer: Self-pay

## 2022-05-25 ENCOUNTER — Inpatient Hospital Stay: Admission: RE | Admit: 2022-05-25 | Payer: Self-pay | Source: Ambulatory Visit
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
## 2024-01-27 ENCOUNTER — Other Ambulatory Visit (HOSPITAL_COMMUNITY)

## 2024-01-27 ENCOUNTER — Emergency Department (HOSPITAL_COMMUNITY): Payer: Self-pay

## 2024-01-27 ENCOUNTER — Emergency Department (HOSPITAL_COMMUNITY)
Admission: EM | Admit: 2024-01-27 | Discharge: 2024-01-27 | Disposition: A | Discharge status: Home / Self Care | Payer: Self-pay | Attending: Emergency Medicine | Admitting: Emergency Medicine

## 2024-01-27 ENCOUNTER — Encounter (HOSPITAL_COMMUNITY): Payer: Self-pay

## 2024-01-27 ENCOUNTER — Other Ambulatory Visit: Payer: Self-pay

## 2024-01-27 DIAGNOSIS — N760 Acute vaginitis: Secondary | ICD-10-CM | POA: Insufficient documentation

## 2024-01-27 DIAGNOSIS — B9689 Other specified bacterial agents as the cause of diseases classified elsewhere: Secondary | ICD-10-CM | POA: Diagnosis not present

## 2024-01-27 DIAGNOSIS — D259 Leiomyoma of uterus, unspecified: Secondary | ICD-10-CM | POA: Diagnosis not present

## 2024-01-27 DIAGNOSIS — R109 Unspecified abdominal pain: Secondary | ICD-10-CM | POA: Diagnosis present

## 2024-01-27 DIAGNOSIS — R102 Pelvic and perineal pain: Secondary | ICD-10-CM

## 2024-01-27 LAB — COMPREHENSIVE METABOLIC PANEL WITH GFR
ALT: 13 U/L (ref 0–44)
AST: 16 U/L (ref 15–41)
Albumin: 3.4 g/dL — ABNORMAL LOW (ref 3.5–5.0)
Alkaline Phosphatase: 52 U/L (ref 38–126)
Anion gap: 7 (ref 5–15)
BUN: 13 mg/dL (ref 6–20)
CO2: 26 mmol/L (ref 22–32)
Calcium: 9 mg/dL (ref 8.9–10.3)
Chloride: 104 mmol/L (ref 98–111)
Creatinine, Ser: 1.08 mg/dL — ABNORMAL HIGH (ref 0.44–1.00)
GFR, Estimated: >60 mL/min (ref >60)
Glucose, Bld: 104 mg/dL — ABNORMAL HIGH (ref 70–99)
Potassium: 4 mmol/L (ref 3.5–5.1)
Sodium: 137 mmol/L (ref 135–145)
Total Bilirubin: 0.4 mg/dL (ref 0.0–1.2)
Total Protein: 6.7 g/dL (ref 6.5–8.1)

## 2024-01-27 LAB — URINALYSIS, ROUTINE W REFLEX MICROSCOPIC
Bilirubin Urine: NEGATIVE
Glucose, UA: NEGATIVE mg/dL
Hgb urine dipstick: NEGATIVE
Ketones, ur: NEGATIVE mg/dL
Leukocytes,Ua: NEGATIVE
Nitrite: NEGATIVE
Protein, ur: NEGATIVE mg/dL
Specific Gravity, Urine: 1.025 (ref 1.005–1.030)
pH: 5 (ref 5.0–8.0)

## 2024-01-27 LAB — CBC
HCT: 39.6 % (ref 36.0–46.0)
Hemoglobin: 12.6 g/dL (ref 12.0–15.0)
MCH: 25.9 pg — ABNORMAL LOW (ref 26.0–34.0)
MCHC: 31.8 g/dL (ref 30.0–36.0)
MCV: 81.3 fL (ref 80.0–100.0)
Platelets: 323 10*3/uL (ref 150–400)
RBC: 4.87 MIL/uL (ref 3.87–5.11)
RDW: 14.9 % (ref 11.5–15.5)
WBC: 10.7 10*3/uL — ABNORMAL HIGH (ref 4.0–10.5)
nRBC: 0 % (ref 0.0–0.2)

## 2024-01-27 LAB — WET PREP, GENITAL
Sperm: NONE SEEN
Trich, Wet Prep: NONE SEEN
WBC, Wet Prep HPF POC: <10 (ref <10)
Yeast Wet Prep HPF POC: NONE SEEN

## 2024-01-27 LAB — HCG, SERUM, QUALITATIVE: Preg, Serum: NEGATIVE

## 2024-01-27 LAB — LIPASE, BLOOD: Lipase: 39 U/L (ref 11–51)

## 2024-01-27 MED ORDER — KETOROLAC TROMETHAMINE 30 MG/ML IJ SOLN
15.0000 mg | Freq: Once | INTRAMUSCULAR | Status: AC
  Administered 2024-01-27: 15 mg via INTRAMUSCULAR
  Filled 2024-01-27: qty 1

## 2024-01-27 MED ORDER — ACETAMINOPHEN 325 MG PO TABS
650.0000 mg | ORAL_TABLET | Freq: Once | ORAL | Status: AC
  Administered 2024-01-27: 650 mg via ORAL
  Filled 2024-01-27: qty 2

## 2024-01-27 MED ORDER — IBUPROFEN 800 MG PO TABS: 800.0000 mg | ORAL_TABLET | Freq: Three times a day (TID) | ORAL | 0 refills | Status: DC

## 2024-01-27 MED ORDER — METRONIDAZOLE 500 MG PO TABS: 500.0000 mg | ORAL_TABLET | Freq: Two times a day (BID) | ORAL | 0 refills | Status: DC

## 2024-01-27 MED ORDER — ACETAMINOPHEN 500 MG PO TABS: 1000.0000 mg | ORAL_TABLET | Freq: Three times a day (TID) | ORAL | 0 refills | Status: DC | PRN

## 2024-01-27 NOTE — ED Triage Notes (Signed)
 Pt c.o lower abd pain/cramping since yesterday, worse on the right. Denies n/v/d, no changes in bowels or urinary s/s.

## 2024-01-27 NOTE — ED Provider Notes (Signed)
 [place] EMERGENCY DEPARTMENT AT [facility] Provider Note   CSN: [id number] Arrival date & time: 01/27/24  0715     History  Chief Complaint  Patient presents with   Abdominal Pain    CADI RHINEHART is a 53 y.o. female presents today for lower abdomen pain/cramping since yesterday.  Patient endorses that the pain is worse on the right when compared to the left.  Patient denies nausea, vomiting, diarrhea, hematuria, dysuria, constipation, chest pain, or shortness of breath.  Patient does admit to possibility of STD, but denies vaginal discharge, pruritus, or odor.  Patient admits to occasional marijuana and cocaine use.   Abdominal Pain      Home Medications Prior to Admission medications   Medication Sig Start Date End Date Taking? Authorizing Provider  metroNIDAZOLE (FLAGYL) 500 MG tablet Take 1 tablet (500 mg total) by mouth 2 (two) times daily. 01/27/24  Yes Dolphus Jenny, PA-C  benzocaine (ORAJEL) 10 % mucosal gel Use as directed 1 application in the mouth or throat 4 (four) times daily as needed for mouth pain. Apply to affected tooth up to 4 times daily 06/30/16   Arvilla Meres L, PA-C  chlorhexidine (PERIDEX) 0.12 % solution Use as directed 15 mLs in the mouth or throat 2 (two) times daily. 10/12/20   Mare Ferrari, PA-C  HYDROcodone-acetaminophen (NORCO/VICODIN) 5-325 MG tablet Take 1 tablet by mouth every 4 (four) hours as needed. 08/01/16   Elpidio Anis, PA-C  ibuprofen (ADVIL,MOTRIN) 200 MG tablet Take 200 mg by mouth every 6 (six) hours as needed for mild pain.    [provider]  ibuprofen (ADVIL,MOTRIN) 800 MG tablet Take 1 tablet (800 mg total) by mouth 3 (three) times daily. 08/01/16   Elpidio Anis, PA-C  LOSARTAN POTASSIUM-HCTZ PO Take by mouth.    [provider]  methocarbamol (ROBAXIN) 500 MG tablet Take 1 tablet (500 mg total) by mouth every 6 (six) hours as needed for muscle spasms. 09/28/17   Blue, Olivia C, PA-C  naproxen  (NAPROSYN) 500 MG tablet Take 1 tablet (500 mg total) by mouth 2 (two) times daily. 09/28/17   Blue, Olivia C, PA-C  oxyCODONE (ROXICODONE) 5 MG immediate release tablet Take 1 tablet (5 mg total) by mouth every 4 (four) hours as needed for severe pain. 01/16/22   Sloan Leiter, DO      Allergies    Patient has no known allergies.    Review of Systems   Review of Systems  Gastrointestinal:  Positive for abdominal pain.  Genitourinary:  Positive for pelvic pain.    Physical Exam Updated Vital Signs BP (!) 175/83   Pulse 82   Resp 20   Ht 5\' 2"  (1.575 m)   Wt 108.9 kg   LMP 01/23/2024 (Approximate)   BMI 43.90 kg/m  Physical Exam Vitals and nursing note reviewed.  Constitutional:      General: She is not in acute distress.    Appearance: She is well-developed. She is obese. She is not ill-appearing, toxic-appearing or diaphoretic.     Comments: Uncomfortable appearing  HENT:     Head: Normocephalic and atraumatic.  Eyes:     Extraocular Movements: Extraocular movements intact.     Conjunctiva/sclera: Conjunctivae normal.  Cardiovascular:     Rate and Rhythm: Normal rate and regular rhythm.     Heart sounds: Normal heart sounds. No murmur heard. Pulmonary:     Effort: Pulmonary effort is normal. No respiratory distress.  Breath sounds: Normal breath sounds.  Abdominal:     General: Abdomen is flat. Bowel sounds are normal.     Palpations: Abdomen is soft.     Tenderness: There is abdominal tenderness in the suprapubic area.     Comments: Right-sided pelvic pain greater than left  Genitourinary:    Comments: Patient declines at this time. Musculoskeletal:        General: No swelling.     Cervical back: Neck supple.  Skin:    General: Skin is warm and dry.     Capillary Refill: Capillary refill takes less than 2 seconds.  Neurological:     General: No focal deficit present.     Mental Status: She is alert.  Psychiatric:        Mood and Affect: Mood normal.      ED Results / Procedures / Treatments   Labs (all labs ordered are listed, but only abnormal results are displayed) Labs Reviewed  WET PREP, GENITAL - Abnormal; Notable for the following components:      Result Value   Clue Cells Wet Prep HPF POC PRESENT (*)    All other components within normal limits  COMPREHENSIVE METABOLIC PANEL WITH GFR - Abnormal; Notable for the following components:   Glucose, Bld 104 (*)    Creatinine, Ser 1.08 (*)    Albumin 3.4 (*)    All other components within normal limits  CBC - Abnormal; Notable for the following components:   WBC 10.7 (*)    MCH 25.9 (*)    All other components within normal limits  URINALYSIS, ROUTINE W REFLEX MICROSCOPIC - Abnormal; Notable for the following components:   APPearance HAZY (*)    All other components within normal limits  LIPASE, BLOOD  HCG, SERUM, QUALITATIVE  GC/CHLAMYDIA PROBE AMP ([place]) NOT AT [facility]    EKG None  Radiology US PELVIC COMPLETE W TRANSVAGINAL AND TORSION R/O Result Date: 01/27/2024 CLINICAL DATA:  Right lower quadrant abdominal pain. EXAM: TRANSABDOMINAL AND TRANSVAGINAL ULTRASOUND OF PELVIS DOPPLER ULTRASOUND OF OVARIES TECHNIQUE: Both transabdominal and transvaginal ultrasound examinations of the pelvis were performed. Transabdominal technique was performed for global imaging of the pelvis including uterus, ovaries, adnexal regions, and pelvic cul-de-sac. It was necessary to proceed with endovaginal exam following the transabdominal exam to visualize the endometrium. Color and duplex Doppler ultrasound was utilized to evaluate blood flow to the ovaries. COMPARISON:  Pelvic ultrasound dated Mar 05, 2019. FINDINGS: Uterus Measurements: 10.9 x 8.4 x 7.4 cm = volume: 357 mL. Multiple intramural fibroids again noted. The largest fibroid in the posterior body measures 5.7 x 5.7 x 5.5 cm, previously 2.4 x 2.3 x 2.6 cm in 2020. Endometrium Thickness: 6 mm.  No focal abnormality visualized. Right  ovary Not visualized. Left ovary Measurements: 2.9 x 2.8 x 2.3 cm = volume: 9.7 mL. 2.4 x 2.0 x 2.2 cm anechoic cyst with single thin internal septation. No follow-up imaging is recommended. Pulsed Doppler evaluation of both ovaries demonstrates normal low-resistance arterial and venous waveforms. Other findings No abnormal free fluid. IMPRESSION: 1. No acute abnormality.  Non-visualization of the right ovary. 2. Multiple uterine fibroids, the largest measuring 5.7 cm, increased in size from 2020. Electronically Signed   By: Obie Dredge M.D.   On: 01/27/2024 09:27    Procedures Procedures    Medications Ordered in ED Medications  acetaminophen (TYLENOL) tablet 650 mg (has no administration in time range)  ketorolac (TORADOL) 30 MG/ML injection 15 mg (15 mg  Intramuscular Given 01/27/24 0802)    ED Course/ Medical Decision Making/ A&P                                 Medical Decision Making Amount and/or Complexity of Data Reviewed Labs: ordered. Radiology: ordered.  Risk Prescription drug management.   This patient presents to the ED for concern of right sided pelvic pain differential diagnosis includes ovarian torsion, cyst, diverticulitis, constipation, appendicitis, gonorrhea, chlamydia, yeast infection, trichomonas, BV  Additional history obtained:  External records from outside source obtained and reviewed including Care Everywhere   Lab Tests:  I Ordered, and personally interpreted labs.  The pertinent results include: Mild leukocytosis at 10.7, mildly elevated creatinine at 1.08, prep with clue cells present   Imaging Studies ordered:  I ordered imaging studies including ultrasound ovarian torsion rule out I independently visualized and interpreted imaging which showed no acute abnormality, nonvisualization of the right ovary.  Multiple uterine fibroids, largest measuring 5.7 cm, increasing in size from 2020. I agree with the radiologist interpretation   Medicines  ordered and prescription drug management:  I ordered medication including Toradol for cramping Reevaluation of the patient after these medicines showed that the patient improved I have reviewed the patients home medicines and have made adjustments as needed   Problem List / ED Course:  Multiple uterine fibroids seen on ultrasound  Considered for admission or further workup however patient's vital signs, physical exam, labs, and imaging were reassuring.  Patient given course of metronidazole for bacterial vaginosis.  Patient advised to take Tylenol Motrin as needed for pain.  Patient to follow-up with OB/GYN for further evaluation and treatment.  Patient still has pending gonorrhea/chlamydia lab, given that the patient is asymptomatic at this time we will wait for these results rather than prophylactically treating.          Final Clinical Impression(s) / ED Diagnoses Final diagnoses:  Pelvic pain in female  Uterine leiomyoma, unspecified location  Bacterial vaginosis    Rx / DC Orders ED Discharge Orders          Ordered    metroNIDAZOLE (FLAGYL) 500 MG tablet  2 times daily        01/27/24 0953              Dolphus Jenny, PA-C 01/27/24 1610    Tegeler, Canary Brim, MD 01/27/24 1201

## 2024-01-27 NOTE — Discharge Instructions (Addendum)
 Today you are seen for bacterial vaginosis and uterine fibroids.  Please pick up your antibiotic and take as prescribed.  Please do not drink while taking this antibiotic.  Follow-up with OB/GYN for further evaluation and possible treatment for your fibroids.  You may alternate taking Tylenol and Motrin as needed for pain.  You have 1 or more labs pending and will be notified of these results and if they require treatment.  Thank you for letting us treat you today. After reviewing your labs and imaging, I feel you are safe to go home. Please follow up with your PCP in the next several days and provide them with your records from this visit. Return to the Emergency Room if pain becomes severe or symptoms worsen.

## 2024-01-27 NOTE — ED Notes (Signed)
Pt verbalized understanding of discharge instructions. Pt ambulated from ed with steady gait.  

## 2024-01-27 NOTE — ED Notes (Signed)
 Pt states she cannot provide a urine sample in triage as she just urinated prior to arrival. Pt very irritable and cursing at staff in triage.

## 2024-01-29 LAB — GC/CHLAMYDIA PROBE AMP (~~LOC~~) NOT AT ARMC
Chlamydia: NEGATIVE
Comment: NEGATIVE
Comment: NORMAL
Neisseria Gonorrhea: NEGATIVE

## 2024-02-02 ENCOUNTER — Emergency Department (HOSPITAL_COMMUNITY)

## 2024-02-02 ENCOUNTER — Encounter (HOSPITAL_COMMUNITY): Payer: Self-pay | Admitting: Emergency Medicine

## 2024-02-02 ENCOUNTER — Emergency Department (HOSPITAL_COMMUNITY)
Admission: EM | Admit: 2024-02-02 | Discharge: 2024-02-03 | Disposition: A | Discharge status: Home / Self Care | Attending: Emergency Medicine | Admitting: Emergency Medicine

## 2024-02-02 DIAGNOSIS — R600 Localized edema: Secondary | ICD-10-CM | POA: Insufficient documentation

## 2024-02-02 DIAGNOSIS — R9431 Abnormal electrocardiogram [ECG] [EKG]: Secondary | ICD-10-CM

## 2024-02-02 DIAGNOSIS — I10 Essential (primary) hypertension: Secondary | ICD-10-CM | POA: Diagnosis present

## 2024-02-02 LAB — CBC WITH DIFFERENTIAL/PLATELET
Abs Immature Granulocytes: 0.01 10*3/uL (ref 0.00–0.07)
Basophils Absolute: 0.1 10*3/uL (ref 0.0–0.1)
Basophils Relative: 1 %
Eosinophils Absolute: 0.2 10*3/uL (ref 0.0–0.5)
Eosinophils Relative: 3 %
HCT: 36.1 % (ref 36.0–46.0)
Hemoglobin: 11.6 g/dL — ABNORMAL LOW (ref 12.0–15.0)
Immature Granulocytes: 0 %
Lymphocytes Relative: 39 %
Lymphs Abs: 3 10*3/uL (ref 0.7–4.0)
MCH: 26 pg (ref 26.0–34.0)
MCHC: 32.1 g/dL (ref 30.0–36.0)
MCV: 80.8 fL (ref 80.0–100.0)
Monocytes Absolute: 0.8 10*3/uL (ref 0.1–1.0)
Monocytes Relative: 11 %
Neutro Abs: 3.5 10*3/uL (ref 1.7–7.7)
Neutrophils Relative %: 46 %
Platelets: 461 10*3/uL — ABNORMAL HIGH (ref 150–400)
RBC: 4.47 MIL/uL (ref 3.87–5.11)
RDW: 14.6 % (ref 11.5–15.5)
WBC: 7.6 10*3/uL (ref 4.0–10.5)
nRBC: 0 % (ref 0.0–0.2)

## 2024-02-02 LAB — BASIC METABOLIC PANEL WITH GFR
Anion gap: 8 (ref 5–15)
BUN: 12 mg/dL (ref 6–20)
CO2: 26 mmol/L (ref 22–32)
Calcium: 9.1 mg/dL (ref 8.9–10.3)
Chloride: 104 mmol/L (ref 98–111)
Creatinine, Ser: 0.89 mg/dL (ref 0.44–1.00)
GFR, Estimated: >60 mL/min (ref >60)
Glucose, Bld: 86 mg/dL (ref 70–99)
Potassium: 3.5 mmol/L (ref 3.5–5.1)
Sodium: 138 mmol/L (ref 135–145)

## 2024-02-02 MED ORDER — HYDRALAZINE HCL 25 MG PO TABS
25.0000 mg | ORAL_TABLET | Freq: Once | ORAL | Status: AC
  Administered 2024-02-02: 25 mg via ORAL
  Filled 2024-02-02: qty 1

## 2024-02-02 MED ORDER — LOSARTAN POTASSIUM 50 MG PO TABS
50.0000 mg | ORAL_TABLET | Freq: Once | ORAL | Status: AC
  Administered 2024-02-02: 50 mg via ORAL
  Filled 2024-02-02: qty 1

## 2024-02-02 NOTE — ED Triage Notes (Signed)
 Pt in with multiple complaints, reports bilateral leg pain and swelling since they began to itch last night. Pt also reporting HTN, states she has been out of her meds x 4-5 mo's. Denies any cp or sob.

## 2024-02-03 DIAGNOSIS — R600 Localized edema: Secondary | ICD-10-CM

## 2024-02-03 HISTORY — DX: Localized edema: R60.0

## 2024-02-03 LAB — BRAIN NATRIURETIC PEPTIDE: B Natriuretic Peptide: 86.1 pg/mL (ref 0.0–100.0)

## 2024-02-03 LAB — TROPONIN I (HIGH SENSITIVITY): Troponin I (High Sensitivity): 4 ng/L (ref <18)

## 2024-02-03 MED ORDER — POTASSIUM CHLORIDE CRYS ER 20 MEQ PO TBCR: 40.0000 meq | EXTENDED_RELEASE_TABLET | Freq: Two times a day (BID) | ORAL | 0 refills | Status: DC

## 2024-02-03 MED ORDER — FUROSEMIDE 20 MG PO TABS: 20.0000 mg | ORAL_TABLET | Freq: Every day | ORAL | 0 refills | Status: DC

## 2024-02-03 MED ORDER — LOSARTAN POTASSIUM 50 MG PO TABS: 50.0000 mg | ORAL_TABLET | Freq: Every day | ORAL | 0 refills | Status: AC

## 2024-02-03 NOTE — Discharge Instructions (Signed)

## 2024-02-03 NOTE — ED Provider Notes (Signed)
  Provider Note MRN:  [id number]  Arrival date & time: 02/03/24    ED Course and Medical Decision Making  Assumed care of patient at sign-out or upon transfer.  Peripheral edema in the setting of uncontrolled blood pressure.  New onset heart failure is in consideration, awaiting labs.  1:30 AM update: Labs are reassuring with negative troponin, normal BNP.  Blood pressure improving, normal vitals, appropriate for discharge.  Procedures  Final Clinical Impressions(s) / ED Diagnoses     ICD-10-CM   1. Uncontrolled hypertension  I10     2. Peripheral edema  R60.0     3. Abnormal EKG  R94.31       ED Discharge Orders          Ordered    Referral to [facility] Care Management       Comments: Pharmacy Medication Management for Uncontrolled hypertension in the ED   02/03/24 0010    losartan (COZAAR) 50 MG tablet  Daily        02/03/24 0010    furosemide (LASIX) 20 MG tablet  Daily,   Status:  Discontinued        02/03/24 0010    potassium chloride SA (KLOR-CON M) 20 MEQ tablet  2 times daily        02/03/24 0011    furosemide (LASIX) 20 MG tablet  Daily        02/03/24 0011              Discharge Instructions      Thank you for the opportunity to take care of you in our Emergency Department. You have been diagnosed with high blood pressure, also known as hypertension. This means that the force of blood against the walls of your blood vessels called is too strong. It also means that your heart has to work harder to move the blood. High blood pressure usually has no symptoms, but over time, it can cause serious health problems such as Heart attack and heart failure Stroke Kidney disease and failure Vision loss With the help from your healthcare provider and some important life style changes, you can manage your blood pressure and protect your health. Please read the instructions provided on hypertension, how to manage it and how to check your blood pressure. Additionally, use  the blood pressure log provided to record your blood pressures. Take the blood pressure log with you to your primary care doctor so that they can adjust your blood pressure medications if needed. Please read the instructions on follow-up appointment. Return to the ER or Call 911 right away if you have any of these symptoms: Chest pain or shortness of breath Severe headache Weakness, tingling, or numbness of your face, arms, or legs (especially on 1 side of the body) Sudden change in vision Confusion, trouble speaking, or trouble understanding speech    Elmer Sow. Pilar Plate, MD [facility] Emergency Medicine [facility] [email or web address]    Sabas Sous, MD 02/03/24 [phone]

## 2024-02-03 NOTE — ED Provider Notes (Signed)
 [place] EMERGENCY DEPARTMENT AT [facility] Provider Note   CSN: [id number] Arrival date & time: 02/02/24  1930     History  Chief Complaint  Patient presents with   Hypertension   Leg Swelling    Kristin Dean is a 53 y.o. female.  HPI    53 year old female comes in with chief complaint of leg swelling and elevated blood pressure. Patient has history of hypertension.  She indicates that she has not been compliant with her medication in the last several months.  In the last few days, she has started noticing increasing swelling in her legs, shortness of breath with exertion.  She checked her blood pressure, and noted that it was significantly high in the 180s.  Patient denies any chest pain.  Home Medications Prior to Admission medications   Medication Sig Start Date End Date Taking? Authorizing Provider  acetaminophen (TYLENOL) 500 MG tablet Take 2 tablets (1,000 mg total) by mouth every 8 (eight) hours as needed for moderate pain (pain score 4-6). 01/27/24  Yes Keith, Kayla N, PA-C  losartan (COZAAR) 50 MG tablet Take 1 tablet (50 mg total) by mouth daily. 02/03/24  Yes Timmey Lamba, MD  metroNIDAZOLE (FLAGYL) 500 MG tablet Take 1 tablet (500 mg total) by mouth 2 (two) times daily. 01/27/24  Yes Keith, Kayla N, PA-C  potassium chloride SA (KLOR-CON M) 20 MEQ tablet Take 2 tablets (40 mEq total) by mouth 2 (two) times daily. 02/03/24  Yes Deatra Face, MD  furosemide (LASIX) 20 MG tablet Take 1 tablet (20 mg total) by mouth daily. 02/03/24   Lilleigh Hechavarria, MD  ibuprofen (ADVIL) 800 MG tablet Take 1 tablet (800 mg total) by mouth 3 (three) times daily. Patient not taking: Reported on 02/02/2024 01/27/24   Carie Charity, PA-C      Allergies    Patient has no known allergies.    Review of Systems   Review of Systems  All other systems reviewed and are negative.   Physical Exam Updated Vital Signs BP (!) 179/101   Pulse 72   Temp 98.6 F (37 C) (Oral)    Resp 20   Wt 108.9 kg   LMP 01/23/2024 (Approximate)   SpO2 99%   BMI 43.90 kg/m  Physical Exam Vitals and nursing note reviewed.  Constitutional:      Appearance: She is well-developed.  HENT:     Head: Atraumatic.  Cardiovascular:     Rate and Rhythm: Normal rate.  Pulmonary:     Effort: Pulmonary effort is normal.  Musculoskeletal:     Cervical back: Normal range of motion and neck supple.     Right lower leg: Edema present.     Left lower leg: Edema present.     Comments: 1+ pitting edema, bilateral, equal  Skin:    General: Skin is warm and dry.  Neurological:     Mental Status: She is alert and oriented to person, place, and time.     ED Results / Procedures / Treatments   Labs (all labs ordered are listed, but only abnormal results are displayed) Labs Reviewed  CBC WITH DIFFERENTIAL/PLATELET - Abnormal; Notable for the following components:      Result Value   Hemoglobin 11.6 (*)    Platelets 461 (*)    All other components within normal limits  BASIC METABOLIC PANEL WITH GFR  BRAIN NATRIURETIC PEPTIDE  TROPONIN I (HIGH SENSITIVITY)    EKG EKG Interpretation Date/Time:  Friday February 02 2024 19:56:05 EDT Ventricular Rate:  82 PR Interval:  124 QRS Duration:  68 QT Interval:  370 QTC Calculation: 432 R Axis:   39  Text Interpretation: Normal sinus rhythm Low voltage QRS T wave abnormality, consider inferior ischemia Abnormal ECG No previous ECGs available No old tracing to compare Confirmed by Deatra Face [phone]) on 02/02/2024 10:46:58 PM  Radiology DG Chest Port 1 View Result Date: 02/02/2024 CLINICAL DATA:  Shortness of breath EXAM: PORTABLE CHEST 1 VIEW COMPARISON:  01/16/2022 FINDINGS: Cardiac shadow is stable. Lungs are well aerated bilaterally. No focal infiltrate or effusion is noted. No bony abnormality is noted. IMPRESSION: No active disease. Electronically Signed   By: Violeta Grey M.D.   On: 02/02/2024 23:15    Procedures Procedures     Medications Ordered in ED Medications  losartan (COZAAR) tablet 50 mg (50 mg Oral Given 02/02/24 2338)  hydrALAZINE (APRESOLINE) tablet 25 mg (25 mg Oral Given 02/02/24 2338)    ED Course/ Medical Decision Making/ A&P                                 Medical Decision Making Amount and/or Complexity of Data Reviewed Labs: ordered. Radiology: ordered.  Risk Prescription drug management.   This patient presents to the ED with chief complaint(s) of elevated blood pressure, leg swelling and also on review of system has shortness of breath with pertinent past medical history of uncontrolled hypertension.  She does not have any known cardiac disease history, pulmonary disease history.  Patient denies any substance use disease..The complaint involves an extensive differential diagnosis and also carries with it a high risk of complications and morbidity.    The differential diagnosis includes uncontrolled hypertension, hypertensive emergency, CHF, pulmonary edema, acute coronary syndrome, valve disorder, venous stasis.  The initial plan is to get basic labs, including BNP and also chest x-ray.  We will get single troponin.   Additional history obtained: Records reviewed Care Everywhere/External Records and Primary Care Documents. It appears that at 1 point patient was on amlodipine.  Independent labs interpretation:  The following labs were independently interpreted: Metabolic profile is normal and reassuring.  BNP and troponin are pending at this time.  Patient's care has been signed out to incoming team. EKG does have some new irregularities.  Could be because of longstanding hypertension.  Low suspicion for ACS right now.  I do not think she will need delta troponin, if initial troponin is reassuring.  Independent visualization and interpretation of imaging: - I independently visualized the following imaging with scope of interpretation limited to determining acute life threatening  conditions related to emergency care: X-ray of the chest, which revealed no evidence of pulmonary edema.  Treatment and Reassessment: Results of the ED workups thus far discussed with the patient.  She is aware that she has uncontrolled hypertension, the ill effects from it and the need for follow-up.  We have prescribed her medications from this visit.  Close follow-up with PCP recommended.   Social Determinants of health: No PCP.   Final Clinical Impression(s) / ED Diagnoses Final diagnoses:  Uncontrolled hypertension  Peripheral edema  Abnormal EKG    Rx / DC Orders ED Discharge Orders          Ordered    Referral to [facility] Care Management       Comments: Pharmacy Medication Management for Uncontrolled hypertension in the ED   02/03/24 0010  losartan (COZAAR) 50 MG tablet  Daily        02/03/24 0010    furosemide (LASIX) 20 MG tablet  Daily,   Status:  Discontinued        02/03/24 0010    potassium chloride SA (KLOR-CON M) 20 MEQ tablet  2 times daily        02/03/24 0011    furosemide (LASIX) 20 MG tablet  Daily        02/03/24 0011              Deatra Face, MD 02/13/24 0710

## 2024-02-05 ENCOUNTER — Telehealth: Payer: Self-pay

## 2024-02-05 ENCOUNTER — Telehealth: Payer: Self-pay | Admitting: *Deleted

## 2024-02-05 NOTE — Progress Notes (Signed)
 Care Guide Pharmacy Note  02/05/2024 Name: Kristin Dean MRN: [id number] DOB: May 07, 1971  Referred By: Lavinia Sharps, NP Reason for referral: Complex Care Management and Call Attempt #1 (Outreach to schedule referral with pharmacist )   Kristin Dean is a 53 y.o. year old female who is a primary care patient of Placey, Chales Abrahams, NP.  [name] was referred to the pharmacist for assistance related to: HTN  An unsuccessful telephone outreach was attempted today to contact the patient who was referred to the pharmacy team for assistance with medication management. Additional attempts will be made to contact the patient.  Burman Nieves, CMA, [place]  [facility], [facility] Guide Direct Dial: [phone]  Fax: [phone] Website: [email or web address]

## 2024-02-05 NOTE — Telephone Encounter (Signed)
 Patient called in wanting to schedule a consult with a provider to discuss removal of her fibroids. Informed patient that Dr. Briscoe Deutscher will not be in our office much over the next few months and gave her the information to the [facility] office.   Sent staff message to them to assist this patient with getting scheduled. Offer the information to our [facility] location and the patient declined due to them "being rude".

## 2024-02-05 NOTE — Telephone Encounter (Signed)
 TC to patient to schedule for New Gyn appointment, no answer.  Left message for call back.

## 2024-02-05 NOTE — Progress Notes (Signed)
 Care Guide Pharmacy Note  02/05/2024 Name: [name] MRN: [id number] DOB: [birth year]  Referred By: Lavinia Sharps, NP Reason for referral: Complex Care Management and Call Attempt #1 (Outreach to schedule referral with pharmacist )   Kristin Dean is a 53 y.o. year old female who is a primary care patient of Placey, Chales Abrahams, NP.  [name] was referred to the pharmacist for assistance related to: HTN  Successful contact was made with the patient to discuss pharmacy services including being ready for the pharmacist to call at least 5 minutes before the scheduled appointment time and to have medication bottles and any blood pressure readings ready for review. The patient agreed to meet with the pharmacist via telephone visit on 02/09/2024  Burman Nieves, CMA [place]  [facility], [facility] Guide Direct Dial: [phone]  Fax: [phone] Website: [email or web address]

## 2024-02-09 ENCOUNTER — Other Ambulatory Visit: Payer: Self-pay

## 2024-02-09 NOTE — Progress Notes (Signed)
 02/09/2024 Name: Kristin Dean MRN: [id number] DOB: 09-20-1971  Chief Complaint  Patient presents with   Hypertension   Medication Management    Kristin Dean is a 53 y.o. year old female who presented for a telephone visit.   They were referred to the pharmacist by ED for assistance in managing hypertension.    Subjective:  Care Team: Primary Care Provider: Wilmer Floor, NP at [facility], next appt sometime next week per patient  Medication Access/Adherence  Current Pharmacy:  [id number] - [place], [place] - [address] AT [facility] [address]  [place] [address] Phone: [phone] Fax: [phone]   Patient reports affordability concerns with their medications: No  Patient reports access/transportation concerns to their pharmacy: No  Patient reports adherence concerns with their medications:  No     Hypertension:  Current medications: hydrochlorothiazide 25mg  daily, losartan 50mg  daily Medications previously tried: lasix (for leg swelling)  Patient does not have a validated, automated, upper arm home BP cuff Current blood pressure readings readings: reports still elevated at last PCP visit but improving  Patient denies hypotensive s/sx including dizziness, lightheadedness.  Patient denies hypertensive symptoms including headache, chest pain, shortness of breath    Objective:  No results found for: "HGBA1C"  Lab Results  Component Value Date   CREATININE 0.89 02/02/2024   BUN 12 02/02/2024   NA 138 02/02/2024   K 3.5 02/02/2024   CL 104 02/02/2024   CO2 26 02/02/2024    Lab Results  Component Value Date   CHOL 230 (H) 09/17/2010   HDL 42 09/17/2010   LDLCALC 171 (H) 09/17/2010   TRIG 86 09/17/2010   CHOLHDL 5.5 Ratio 09/17/2010    Medications Reviewed Today     Reviewed by Sherrill Raring, RPH (Pharmacist) on 02/09/24 at 1040  Med List Status: <None>    Medication Order Taking? Sig Documenting Provider Last Dose Status Informant  acetaminophen (TYLENOL) 500 MG tablet [id number] Yes Take 2 tablets (1,000 mg total) by mouth every 8 (eight) hours as needed for moderate pain (pain score 4-6). Dolphus Jenny, PA-C Taking Active Self  furosemide (LASIX) 20 MG tablet [id number] No Take 1 tablet (20 mg total) by mouth daily.  Patient not taking: Reported on 02/09/2024   Derwood Kaplan, MD Not Taking Active   hydrochlorothiazide (HYDRODIURIL) 25 MG tablet [id number] Yes Take 25 mg by mouth daily. [provider] Taking Active   ibuprofen (ADVIL) 800 MG tablet [id number] Yes Take 1 tablet (800 mg total) by mouth 3 (three) times daily. Dolphus Jenny, PA-C Taking Active Self  losartan (COZAAR) 50 MG tablet [id number] Yes Take 1 tablet (50 mg total) by mouth daily. Derwood Kaplan, MD Taking Active   metFORMIN (GLUCOPHAGE) 500 MG tablet [id number] Yes Take 500 mg by mouth daily. [provider] Taking Active   predniSONE (DELTASONE) 2.5 MG tablet [id number] Yes Take 2.5 mg by mouth daily. [provider] Taking Active   Med List Note Josepha Pigg, CPhT 08/15/12 6237): Doesn't have a pharmacy she uses regularly              Assessment/Plan:   Hypertension: - Currently uncontrolled - Reviewed long term cardiovascular and renal outcomes of uncontrolled blood pressure - Reviewed appropriate blood pressure monitoring technique and reviewed goal blood pressure. Recommended to check home blood pressure and heart rate daily. Discussed purchasing a bp cuff for home or places in public that can check -  Recommend to continue current medication therapy and keep f/u with PCP -Discussed return ED precautions -Counseled on limiting sodium/caffeine intake.    Sherrill Raring, PharmD Clinical Pharmacist [phone]

## 2024-02-21 ENCOUNTER — Ambulatory Visit: Admitting: Obstetrics and Gynecology

## 2024-02-21 ENCOUNTER — Other Ambulatory Visit (HOSPITAL_COMMUNITY)
Admission: RE | Admit: 2024-02-21 | Discharge: 2024-02-21 | Disposition: A | Discharge status: Home / Self Care | Source: Ambulatory Visit | Attending: Obstetrics and Gynecology | Admitting: Obstetrics and Gynecology

## 2024-02-21 ENCOUNTER — Encounter: Payer: Self-pay | Admitting: Obstetrics and Gynecology

## 2024-02-21 VITALS — BP 124/82 | HR 72 | Ht 62.0 in | Wt 221.0 lb

## 2024-02-21 DIAGNOSIS — Z01419 Encounter for gynecological examination (general) (routine) without abnormal findings: Secondary | ICD-10-CM | POA: Insufficient documentation

## 2024-02-21 DIAGNOSIS — Z113 Encounter for screening for infections with a predominantly sexual mode of transmission: Secondary | ICD-10-CM | POA: Insufficient documentation

## 2024-02-21 DIAGNOSIS — Z124 Encounter for screening for malignant neoplasm of cervix: Secondary | ICD-10-CM | POA: Diagnosis not present

## 2024-02-21 DIAGNOSIS — D219 Benign neoplasm of connective and other soft tissue, unspecified: Secondary | ICD-10-CM

## 2024-02-21 DIAGNOSIS — N939 Abnormal uterine and vaginal bleeding, unspecified: Secondary | ICD-10-CM

## 2024-02-21 DIAGNOSIS — N393 Stress incontinence (female) (male): Secondary | ICD-10-CM | POA: Diagnosis not present

## 2024-02-21 DIAGNOSIS — Z1151 Encounter for screening for human papillomavirus (HPV): Secondary | ICD-10-CM | POA: Diagnosis not present

## 2024-02-21 MED ORDER — NORETHINDRONE ACETATE 5 MG PO TABS: 5.0000 mg | ORAL_TABLET | Freq: Every day | ORAL | 2 refills | Status: DC

## 2024-02-21 NOTE — Progress Notes (Signed)
 NEW GYNECOLOGY PATIENT Patient name: Kristin Dean MRN [id number]  Date of birth: [birth year] Chief Complaint:   No chief complaint on file.     History:  Kristin Dean is a 53 y.o. G2P2002 being seen today for establishment of care and AUB.  Discussed the use of AI scribe software for clinical note transcription with the patient, who gave verbal consent to proceed.  History of Present Illness Kristin Dean is a 53 year old female who presents with prolonged menstrual bleeding and pain.  She has been experiencing prolonged menstrual bleeding and pain for the past three weeks, which began after a visit to the emergency room where she was informed that her fibroids had increased in size since 2020. The bleeding has been intermittent, stopping two days ago. Prior to this, she experienced irregular menstrual cycles since Thanksgiving, with periods occurring twice a month (at the beginning and end of the month).  She has a history of long and painful menstrual periods since 2013, with episodes lasting up to two weeks. The pain is associated with her periods and is severe enough to require medication. She has been using Excedrin, Midol , and a heating pad for pain relief. Recently, she was prescribed metformin and prednisone, which have helped manage the pain and allowed her to sleep after visiting [facility].  She has a history of high blood pressure, which she attributes to stress following her mother's death last year. Her blood pressure was recorded at 135 mmHg recently. She also reports daily tobacco use and is working on cessation.  She was prescribed patches, and instructed she has a quit date so that she can start using the patches instead of her cigarettes.  She reports constipation, which she associates with antibiotic use for a dental procedure and subsequent treatment for vaginosis. She typically has regular bowel movements with cigarette use but has  experienced changes since starting antibiotics. No significant weight changes, skin or hair texture changes, or new medications other than metformin and prednisone. Her last recorded weight was 236 pounds around Thanksgiving.       Gynecologic History Patient's last menstrual period was 01/23/2024 (approximate). Contraception: tubal ligation Last Pap: 1/23/202 NILM, HPV negative Last Mammogram:  2023 BIRADS 1 Last Colonoscopy:  none seen on file  Obstetric History OB History  Gravida Para Term Preterm AB Living  2 2 2   2   SAB IAB Ectopic Multiple Live Births          # Outcome Date GA Lbr Len/2nd Weight Sex Type Anes PTL Lv  2 Term           1 Term             Past Medical History:  Diagnosis Date   Chronic headaches    Hypertension    Knee dislocation     Past Surgical History:  Procedure Laterality Date   TUBAL LIGATION      Current Outpatient Medications on File Prior to Visit  Medication Sig Dispense Refill   acetaminophen  (TYLENOL ) 500 MG tablet Take 2 tablets (1,000 mg total) by mouth every 8 (eight) hours as needed for moderate pain (pain score 4-6). 30 tablet 0   furosemide  (LASIX ) 20 MG tablet Take 1 tablet (20 mg total) by mouth daily. (Patient not taking: Reported on 02/09/2024) 10 tablet 0   hydrochlorothiazide (HYDRODIURIL) 25 MG tablet Take 25 mg by mouth daily.     ibuprofen  (ADVIL ) 800 MG tablet Take 1  tablet (800 mg total) by mouth 3 (three) times daily. 21 tablet 0   losartan  (COZAAR ) 50 MG tablet Take 1 tablet (50 mg total) by mouth daily. 30 tablet 0   metFORMIN (GLUCOPHAGE) 500 MG tablet Take 500 mg by mouth daily.     predniSONE (DELTASONE) 2.5 MG tablet Take 2.5 mg by mouth daily.     No current facility-administered medications on file prior to visit.    No Known Allergies  Social History:  reports that she has been smoking. She has never used smokeless tobacco. She reports current alcohol use. She reports current drug use. Drug:  Marijuana.  Family History  Problem Relation Age of Onset   Diabetes Mother    Arthritis Mother    Hypertension Father     The following portions of the patient's history were reviewed and updated as appropriate: allergies, current medications, past family history, past medical history, past social history, past surgical history and problem list.  Review of Systems Pertinent items noted in HPI and remainder of comprehensive ROS otherwise negative.  Physical Exam:  BP 124/82   Pulse 72   Ht 5\' 2"  (1.575 m)   Wt 221 lb (100.2 kg)   LMP 01/23/2024 (Approximate)   BMI 40.42 kg/m  Physical Exam Vitals and nursing note reviewed. Exam conducted with a chaperone present.  Constitutional:      Appearance: Normal appearance.  Pulmonary:     Effort: Pulmonary effort is normal.  Chest:  Breasts:    Right: Normal. No mass, skin change or tenderness.     Left: Normal. No mass, skin change or tenderness.  Abdominal:     Palpations: Abdomen is soft.  Genitourinary:    General: Normal vulva.     Exam position: Lithotomy position.     Vagina: Normal.     Cervix: Normal.     Comments: Posterior cervix Nontender pelvic floor muscles Neurological:     Mental Status: She is alert.        Assessment and Plan:   Assessment & Plan Uterine fibroids with abnormal uterine bleeding Long-standing uterine fibroids causing abnormal bleeding. Discussed medical and surgical options, with hysterectomy as definitive treatment. Endometrial biopsy required before hysterectomy. Lupron considered for interim symptom management vs oral medication - Return for endometrial biopsy at a later date. - Patient would like to proceed with definitive management via hysterectomy, we will go ahead and submit surgical scheduling request - Perform Pap smear and STD testing today.  Tobacco use Daily tobacco use. She received nicotine patches and is planning to quit. - Encourage setting a quit date and using  nicotine patches.  Constipation Constipation likely related to antibiotic use. Probiotics discussed but evidence is weak. - Consider probiotics if constipation persists.  Well Woman Exam - Cervical cancer screening: Discussed guidelines. Pap with HPV collected - Breast Health: Encouraged self breast awareness/SBE. Discussed limits of clinical breast exam for detecting breast cancer. Discussed importance of annual MXR. Rx given for MXR - Climacteric/Sexual health: Reviewed typical and atypical symptoms of menopause/peri-menopause. Discussed PMB and to call if any amount of spotting.  - Colonoscopy: Per PCP - F/U 12 months and prn   Printed patient education handouts about the procedure were given to the patient to review at home.    Routine preventative health maintenance measures emphasized. Please refer to After Visit Summary for other counseling recommendations.   Follow-up: No follow-ups on file.      Kiki Pelton, MD Obstetrician & Gynecologist, Faculty Practice Minimally  Invasive Gynecologic Surgery Center for [employer], [facility]

## 2024-02-21 NOTE — Progress Notes (Signed)
 Pt has been having severe pelvic pain and prolong bleeding- was told due to fibroids at ED visit. Pt would like to discuss surgical options in treating fibroids.

## 2024-02-22 ENCOUNTER — Encounter: Payer: Self-pay | Admitting: Obstetrics and Gynecology

## 2024-02-22 LAB — CERVICOVAGINAL ANCILLARY ONLY
Bacterial Vaginitis (gardnerella): NEGATIVE
Candida Glabrata: NEGATIVE
Candida Vaginitis: NEGATIVE
Chlamydia: NEGATIVE
Comment: NEGATIVE
Comment: NEGATIVE
Comment: NEGATIVE
Comment: NEGATIVE
Comment: NEGATIVE
Comment: NORMAL
Neisseria Gonorrhea: NEGATIVE
Trichomonas: NEGATIVE

## 2024-02-22 LAB — HIV ANTIBODY (ROUTINE TESTING W REFLEX): HIV Screen 4th Generation wRfx: NONREACTIVE

## 2024-02-22 LAB — CYTOLOGY - PAP
Adequacy: ABSENT
Comment: NEGATIVE
Diagnosis: NEGATIVE
High risk HPV: NEGATIVE

## 2024-02-22 LAB — HEPATITIS B SURFACE ANTIGEN: Hepatitis B Surface Ag: NEGATIVE

## 2024-02-22 LAB — HEMOGLOBIN A1C
Est. average glucose Bld gHb Est-mCnc: 140 mg/dL
Hgb A1c MFr Bld: 6.5 % — ABNORMAL HIGH (ref 4.8–5.6)

## 2024-02-22 LAB — HEPATITIS C ANTIBODY: Hep C Virus Ab: NONREACTIVE

## 2024-02-22 LAB — TSH RFX ON ABNORMAL TO FREE T4: TSH: 0.605 u[IU]/mL (ref 0.450–4.500)

## 2024-02-22 LAB — RPR: RPR Ser Ql: NONREACTIVE

## 2024-02-28 ENCOUNTER — Ambulatory Visit
Admission: RE | Admit: 2024-02-28 | Discharge: 2024-02-28 | Disposition: A | Discharge status: Home / Self Care | Source: Ambulatory Visit | Attending: Obstetrics and Gynecology

## 2024-02-28 DIAGNOSIS — Z01419 Encounter for gynecological examination (general) (routine) without abnormal findings: Secondary | ICD-10-CM

## 2024-02-29 ENCOUNTER — Encounter: Payer: Self-pay | Admitting: Obstetrics and Gynecology

## 2024-03-20 ENCOUNTER — Telehealth: Payer: Self-pay

## 2024-03-20 NOTE — Telephone Encounter (Signed)
 I called patient to discuss her surgery w/ Dr. Elester Grim and to confirm her insurance carrier. I left a detailed message explaining that Dr. Elester Grim may be available in July and to please call me to schedule at [phone]

## 2024-03-20 NOTE — Telephone Encounter (Signed)
 Patient called me back regarding her surgery w/ Dr. Elester Grim. Patient confirmed she only has [employer] PPO. Patient states she has trip planned for the end of July. Patient will wait for the August schedule to be released.

## 2024-04-03 ENCOUNTER — Telehealth: Payer: Self-pay

## 2024-04-03 NOTE — Telephone Encounter (Signed)
 Returned call, pt states that she has been bleeding for the last 7 days but has a biopsy appt on Friday that has to be done before surgery. Pt does not want surgery to get pushed back and is wanting to know how to proceed. Messaged provider and advised will follow up.

## 2024-04-04 ENCOUNTER — Telehealth: Payer: Self-pay

## 2024-04-04 NOTE — Telephone Encounter (Signed)
 S/w pt and advised that provider wants her to increase Aygestin  to 10mg  daily to slow down bleeding. Pt states that he bleeding did slow down today but she will increase and come to her appt tomorrow.

## 2024-04-05 ENCOUNTER — Telehealth: Payer: Self-pay | Admitting: *Deleted

## 2024-04-05 ENCOUNTER — Other Ambulatory Visit (HOSPITAL_COMMUNITY)
Admission: RE | Admit: 2024-04-05 | Discharge: 2024-04-05 | Disposition: A | Discharge status: Home / Self Care | Source: Ambulatory Visit | Attending: Obstetrics and Gynecology | Admitting: Obstetrics and Gynecology

## 2024-04-05 ENCOUNTER — Ambulatory Visit: Admitting: Obstetrics and Gynecology

## 2024-04-05 VITALS — BP 130/82 | HR 84 | Wt 221.0 lb

## 2024-04-05 DIAGNOSIS — N939 Abnormal uterine and vaginal bleeding, unspecified: Secondary | ICD-10-CM | POA: Insufficient documentation

## 2024-04-05 MED ORDER — NORETHINDRONE ACETATE 5 MG PO TABS: 10.0000 mg | ORAL_TABLET | Freq: Every day | ORAL | 2 refills | Status: DC

## 2024-04-05 NOTE — Telephone Encounter (Signed)
 Placed call to pt- LVM making her aware she needs to come by office to sign Hysterectomy statement.

## 2024-04-05 NOTE — Progress Notes (Signed)
 Pt states LMP 5/28 and still bleeding today. UPT in office is negative today.

## 2024-04-05 NOTE — Addendum Note (Signed)
 Addended by: Ethen Bannan O on: 04/05/2024 09:51 AM   Modules accepted: Orders, Level of Service

## 2024-04-05 NOTE — Progress Notes (Signed)
   Endometrial Biopsy Procedure  Patient identified, informed consent performed,  indication reviewed, consent signed.  Reviewed risk of perforation, pain, bleeding, insufficient sample, etc were reviewd. Time out was performed.  Urine pregnancy test negative.  Speculum placed in the vagina.  Cervix visualized, normal appearing cervix with small volume dark blood noted at os.  Cleaned with Betadine x 3.  Paracervical block was not administered.  Endometrial pipelle was used to draw up 1cc of 1% lidocaine , introduced into the cervical os and instilled into the endometrial cavity.  The pipelle was passed twce without difficulty and sample obtained. Tenaculum was removed, good hemostasis noted.  Patient tolerated procedure well.  Patient was given post-procedure instructions.   1. Abnormal uterine bleeding (AUB) (Primary) Follow up EMB results. Increased aygestin  to 10mg  daily. Planning for hysterectomy.  - POCT urine pregnancy - Surgical pathology - norethindrone  (AYGESTIN ) 5 MG tablet; Take 2 tablets (10 mg total) by mouth daily.  Dispense: 60 tablet; Refill: 2   Kiki Pelton, MD, FACOG Minimally Invasive Gynecologic Surgery  Obstetrics and Gynecology, [facility] for [facility], [facility] 04/05/2024

## 2024-04-09 ENCOUNTER — Ambulatory Visit: Payer: Self-pay | Admitting: Obstetrics and Gynecology

## 2024-04-09 LAB — SURGICAL PATHOLOGY

## 2024-04-24 ENCOUNTER — Telehealth: Payer: Self-pay

## 2024-04-24 NOTE — Telephone Encounter (Signed)
 Patient left a voicemail stating she is available on 06/11/24 and would like to be scheduled w/ Dr. Jeralyn, and to please send all details via Mychart.

## 2024-04-24 NOTE — Telephone Encounter (Signed)
 I left a voicemail stating Dr. Jeralyn August schedule is now available and 06/11/24 is open. I asked patient to call me back to schedule at [phone].

## 2024-04-25 ENCOUNTER — Telehealth: Payer: Self-pay

## 2024-04-25 NOTE — Telephone Encounter (Signed)
 Patient called back to confirm her availability for surgery w/ Dr. Jeralyn on 06/11/24. I provided pre-op instructions and surgery details over the phone.

## 2024-06-05 ENCOUNTER — Encounter (HOSPITAL_COMMUNITY): Payer: Self-pay | Admitting: Obstetrics and Gynecology

## 2024-06-07 ENCOUNTER — Encounter (HOSPITAL_COMMUNITY): Payer: Self-pay

## 2024-06-07 ENCOUNTER — Encounter (HOSPITAL_COMMUNITY): Payer: Self-pay | Admitting: Obstetrics and Gynecology

## 2024-06-07 NOTE — Progress Notes (Addendum)
 Addendum:  patient came for PAT lab appointment.  Patient is very very anxious with needles.  She was coming out the chair before the lab tech even had the needle in the arm started to hyperventilate.  I held her hand and put hand on her face to console her to get to do slow deep breaths, made though until when needle taken out reacted to that but was done then.  Pt was to bring me name of depression medication he takes in the morning since she did not know name of it at pre-op interview she did not.  Asked her to call me today or bring name day of surgery. Told her she could it morning of surgery with sips of water.  Spoke w/ via phone for pre-op interview--- pt Lab needs dos----   upt (per anes)      Lab results------ lab appt 06-10-2024 @ 1030 getting T&S/  CBC&BMP (per anes) COVID test -----patient states asymptomatic no test needed Arrive at ------- 1045 on 06-11-2024 NPO after MN NO Solid Food.  Clear liquids from MN until--- 0945 Pre-Surgery Ensure or G2: n/a  Med rec completed Medications to take morning of surgery ----- none Diabetic medication ----- n/a  GLP1 agonist last dose: n/a GLP1 instructions:  Patient instructed no nail polish to be worn day of surgery Patient instructed to bring photo id and insurance card day of surgery Patient aware to have Driver (ride ) / caregiver    for 24 hours after surgery - cousin, aretha nichols Patient Special Instructions ----- will pick soap and written instructions at lab appt  Pre-Op special Instructions ----- pt has permanent bracelet.  Even I explained to patient permanent must be removed prior to arrival day of surgery and why , pt stated she will not have it taken off even professionally because her mother given/ paid for her before she passed away so this is very sentimental to her.  Stated she was told by a friend when she had surgery they just covered it up, informed I could only speak about [place] guideline not where her friend  had her surgery.  Advised patient day of surgery speak to anesthesia and let them know . Also, her fingernails,  she does want to comply with guidelines but I think she agreed with at least the index finger on each hand to be bear without anything on them.  Patient verbalized understanding of instructions that were given at this phone interview. Patient denies chest pain, sob, fever, cough at the interview.

## 2024-06-07 NOTE — Pre-Procedure Instructions (Signed)
 Surgical Instructions   Your procedure is scheduled on :  Tuesday,  06-11-2024. Report to [facility] Main Entrance A at 10:45  A.M., then check in with the Admitting office. Any questions or running late day of surgery: call [phone]  Questions prior to your surgery date: call [phone], Monday-Friday, 8am-4pm. If you experience any cold or flu symptoms such as cough, fever, chills, shortness of breath, etc. between now and your scheduled surgery, please notify your surgeon office.    Remember:  Do not eat any food after midnight the night before your surgery. You may have clear liquids from midnight night before surgery until 9:45 AM.   Clear liquids allowed are:  Water Carbonated Beverages  (low sugar or diet) Clear Tea (no milk, honey, etc.) Black Coffee Only (NO MILK, CREAM OR POWDERED CREAMER of any kind) Sport drinks, like Gatorade  (low sugar or diet)  NO clear liquids after 9:45 AM day of surgery.  This includes no water,  candy,  gum,  and  mints.    Take these medicines the morning of surgery with A SIP OF WATER :  none    May take these medicines IF NEEDED:  none    One week prior to surgery, STOP taking any Aspirin (unless otherwise instructed by your surgeon) Aleve , Naproxen , Ibuprofen , Motrin , Advil , Goody's, BC's, all herbal medications, fish oil, and non-prescription vitamins.                     Do NOT Smoke (Tobacco/Marijuna) dn Do Not drink alcohol for 24 hours prior to your procedure.  If you use a CPAP at night, you may bring your mask/headgear for your overnight stay.   You will be asked to remove any contacts, glasses, piercing's, hearing aid's, dentures/partials prior to surgery. Please bring cases for these items day of surgery.    Patients discharged the day of surgery will not be allowed to drive home, and someone needs to stay with them for 24 hours.  SURGICAL WAITING ROOM VISITATION Patients may have no more than 2 support people in  the waiting area - these visitors may rotate.   Pre-op nurse will coordinate an appropriate time for 1 ADULT support person, who may not rotate, to accompany patient in pre-op.  Children under the age of 33 must have an adult with them who is not the patient and must remain in the main waiting area with an adult.  If the patient needs to stay at the hospital during part of their recovery, the visitor guidelines for inpatient rooms apply.  Please refer to the [facility] website for the visitor guidelines for any additional information.   If you received a COVID test during your pre-op visit  it is requested that you wear a mask when out in public, stay away from anyone that may not be feeling well and notify your surgeon if you develop symptoms. If you have been in contact with anyone that has tested positive in the last 10 days please notify you surgeon.      Pre-operative CHG Bathing Instructions   You can play a key role in reducing the risk of infection after surgery. Your skin needs to be as free of germs as possible. You can reduce the number of germs on your skin by washing with CHG (chlorhexidine  gluconate) soap before surgery. CHG is an antiseptic soap that kills germs and continues to kill germs even after washing.   DO NOT use if you  have an allergy to chlorhexidine /CHG or antibacterial soaps. If your skin becomes reddened or irritated, stop using the CHG and notify one of our RN day of surgery.             TAKE A SHOWER THE NIGHT BEFORE SURGERY AND THE DAY OF SURGERY    Please keep in mind the following:  DO NOT shave, including legs and underarms, 48 hours prior to surgery.   You may shave your face before/day of surgery.  Place clean sheets on your bed the night before surgery Use a clean washcloth (not used since being washed) for each shower. DO NOT sleep with pet's night before surgery.  CHG Shower Instructions:  Wash your face and private area with normal soap. If you  choose to wash your hair, wash first with your normal shampoo.  After you use shampoo/soap, rinse your hair and body thoroughly to remove shampoo/soap residue.  Turn the water OFF and apply half the bottle of CHG soap to a CLEAN washcloth.  Apply CHG soap ONLY FROM YOUR NECK DOWN TO YOUR TOES (washing for 3-5 minutes)  DO NOT use CHG soap on face, private areas, open wounds, or sores.  Pay special attention to the area where your surgery is being performed.  If you are having back surgery, having someone wash your back for you may be helpful. Wait 2 minutes after CHG soap is applied, then you may rinse off the CHG soap.  Pat dry with a clean towel  Put on clean pajamas    Additional instructions for the day of surgery: DO NOT APPLY any lotions,  powder,  oils,  deodorants (may use underarm deodorant) , cologne/  perfumes   or makeup Do not wear jewelry /  piercing's/ metal/  permanent jewelry must be removed prior to arrival day of surgery.  (No plastic piercing) Do not wear nail polish, gel polish, artificial nails, or any other type of covering on natural finger nails (toe nails are okay) Do not bring valuables to the hospital. [facility] is not responsible for valuables/personal belongings. Put on clean/comfortable clothes.  Please brush your teeth.  Ask your nurse before applying any prescription medications to the skin.

## 2024-06-10 ENCOUNTER — Encounter (HOSPITAL_COMMUNITY)
Admission: RE | Admit: 2024-06-10 | Discharge: 2024-06-10 | Disposition: A | Discharge status: Home / Self Care | Source: Ambulatory Visit | Attending: Obstetrics and Gynecology

## 2024-06-10 DIAGNOSIS — Z01818 Encounter for other preprocedural examination: Secondary | ICD-10-CM

## 2024-06-10 DIAGNOSIS — Z01812 Encounter for preprocedural laboratory examination: Secondary | ICD-10-CM | POA: Insufficient documentation

## 2024-06-10 DIAGNOSIS — N939 Abnormal uterine and vaginal bleeding, unspecified: Secondary | ICD-10-CM | POA: Insufficient documentation

## 2024-06-10 LAB — BASIC METABOLIC PANEL WITH GFR
Anion gap: 12 (ref 5–15)
BUN: 9 mg/dL (ref 6–20)
CO2: 23 mmol/L (ref 22–32)
Calcium: 9.2 mg/dL (ref 8.9–10.3)
Chloride: 103 mmol/L (ref 98–111)
Creatinine, Ser: 0.96 mg/dL (ref 0.44–1.00)
GFR, Estimated: >60 mL/min (ref >60)
Glucose, Bld: 113 mg/dL — ABNORMAL HIGH (ref 70–99)
Potassium: 4.2 mmol/L (ref 3.5–5.1)
Sodium: 138 mmol/L (ref 135–145)

## 2024-06-10 LAB — TYPE AND SCREEN
ABO/RH(D): A POS
Antibody Screen: NEGATIVE

## 2024-06-10 LAB — CBC
HCT: 45.6 % (ref 36.0–46.0)
Hemoglobin: 14.9 g/dL (ref 12.0–15.0)
MCH: 26 pg (ref 26.0–34.0)
MCHC: 32.7 g/dL (ref 30.0–36.0)
MCV: 79.6 fL — ABNORMAL LOW (ref 80.0–100.0)
Platelets: 391 K/uL (ref 150–400)
RBC: 5.73 MIL/uL — ABNORMAL HIGH (ref 3.87–5.11)
RDW: 16.9 % — ABNORMAL HIGH (ref 11.5–15.5)
WBC: 7.4 K/uL (ref 4.0–10.5)
nRBC: 0 % (ref 0.0–0.2)

## 2024-06-11 ENCOUNTER — Ambulatory Visit (HOSPITAL_COMMUNITY)

## 2024-06-11 ENCOUNTER — Encounter (HOSPITAL_COMMUNITY)
Admission: RE | Disposition: A | Discharge status: Home / Self Care | Payer: Self-pay | Source: Home / Self Care | Attending: Obstetrics and Gynecology

## 2024-06-11 ENCOUNTER — Other Ambulatory Visit: Payer: Self-pay

## 2024-06-11 ENCOUNTER — Ambulatory Visit (HOSPITAL_COMMUNITY)
Admission: RE | Admit: 2024-06-11 | Discharge: 2024-06-11 | Disposition: A | Discharge status: Home / Self Care | Attending: Obstetrics and Gynecology | Admitting: Obstetrics and Gynecology

## 2024-06-11 ENCOUNTER — Encounter (HOSPITAL_COMMUNITY): Payer: Self-pay | Admitting: Obstetrics and Gynecology

## 2024-06-11 DIAGNOSIS — D251 Intramural leiomyoma of uterus: Secondary | ICD-10-CM

## 2024-06-11 DIAGNOSIS — E66813 Obesity, class 3: Secondary | ICD-10-CM | POA: Diagnosis not present

## 2024-06-11 DIAGNOSIS — Z6841 Body Mass Index (BMI) 40.0 and over, adult: Secondary | ICD-10-CM | POA: Diagnosis not present

## 2024-06-11 DIAGNOSIS — D259 Leiomyoma of uterus, unspecified: Secondary | ICD-10-CM

## 2024-06-11 DIAGNOSIS — F17210 Nicotine dependence, cigarettes, uncomplicated: Secondary | ICD-10-CM | POA: Diagnosis not present

## 2024-06-11 DIAGNOSIS — N840 Polyp of corpus uteri: Secondary | ICD-10-CM | POA: Diagnosis not present

## 2024-06-11 DIAGNOSIS — N939 Abnormal uterine and vaginal bleeding, unspecified: Secondary | ICD-10-CM | POA: Diagnosis present

## 2024-06-11 DIAGNOSIS — I10 Essential (primary) hypertension: Secondary | ICD-10-CM | POA: Insufficient documentation

## 2024-06-11 DIAGNOSIS — Z79899 Other long term (current) drug therapy: Secondary | ICD-10-CM | POA: Insufficient documentation

## 2024-06-11 DIAGNOSIS — Z01818 Encounter for other preprocedural examination: Secondary | ICD-10-CM

## 2024-06-11 DIAGNOSIS — N888 Other specified noninflammatory disorders of cervix uteri: Secondary | ICD-10-CM | POA: Diagnosis not present

## 2024-06-11 DIAGNOSIS — N938 Other specified abnormal uterine and vaginal bleeding: Secondary | ICD-10-CM | POA: Diagnosis not present

## 2024-06-11 HISTORY — DX: Unspecified osteoarthritis, unspecified site: M19.90

## 2024-06-11 HISTORY — DX: Presence of spectacles and contact lenses: Z97.3

## 2024-06-11 HISTORY — DX: Dyspnea, unspecified: R06.00

## 2024-06-11 HISTORY — DX: Stress incontinence (female) (male): N39.3

## 2024-06-11 HISTORY — DX: Gastro-esophageal reflux disease without esophagitis: K21.9

## 2024-06-11 HISTORY — DX: Abnormal uterine and vaginal bleeding, unspecified: N93.9

## 2024-06-11 HISTORY — DX: Anemia, unspecified: D64.9

## 2024-06-11 HISTORY — DX: Prediabetes: R73.03

## 2024-06-11 LAB — ABO/RH: ABO/RH(D): A POS

## 2024-06-11 LAB — POCT PREGNANCY, URINE: Preg Test, Ur: NEGATIVE

## 2024-06-11 SURGERY — HYSTERECTOMY, TOTAL, LAPAROSCOPIC, WITH SALPINGECTOMY
Anesthesia: General | Site: Pelvis

## 2024-06-11 MED ORDER — ONDANSETRON HCL 4 MG/2ML IJ SOLN
INTRAMUSCULAR | Status: DC | PRN
  Administered 2024-06-11 (×2): 4 mg via INTRAVENOUS

## 2024-06-11 MED ORDER — FENTANYL CITRATE (PF) 250 MCG/5ML IJ SOLN
INTRAMUSCULAR | Status: AC
  Filled 2024-06-11: qty 5

## 2024-06-11 MED ORDER — FENTANYL CITRATE (PF) 100 MCG/2ML IJ SOLN
25.0000 ug | INTRAMUSCULAR | Status: DC | PRN
  Administered 2024-06-11 (×2): 50 ug via INTRAVENOUS

## 2024-06-11 MED ORDER — ORAL CARE MOUTH RINSE: 15.0000 mL | Freq: Once | OROMUCOSAL | Status: AC

## 2024-06-11 MED ORDER — OXYCODONE HCL 5 MG PO TABS
ORAL_TABLET | ORAL | Status: AC
  Filled 2024-06-11: qty 1

## 2024-06-11 MED ORDER — PROPOFOL 10 MG/ML IV BOLUS
INTRAVENOUS | Status: AC
  Filled 2024-06-11: qty 20

## 2024-06-11 MED ORDER — CEFAZOLIN SODIUM-DEXTROSE 2-4 GM/100ML-% IV SOLN
2.0000 g | INTRAVENOUS | Status: AC
  Administered 2024-06-11 (×2): 2 g via INTRAVENOUS

## 2024-06-11 MED ORDER — 0.9 % SODIUM CHLORIDE (POUR BTL) OPTIME
TOPICAL | Status: DC | PRN
  Administered 2024-06-11 (×2): 1000 mL

## 2024-06-11 MED ORDER — IBUPROFEN 200 MG PO CAPS: 2.0000 | ORAL_CAPSULE | Freq: Three times a day (TID) | ORAL | 1 refills | Status: DC | PRN

## 2024-06-11 MED ORDER — ROCURONIUM BROMIDE 10 MG/ML (PF) SYRINGE
PREFILLED_SYRINGE | INTRAVENOUS | Status: AC
  Filled 2024-06-11: qty 10

## 2024-06-11 MED ORDER — SCOPOLAMINE 1 MG/3DAYS TD PT72
1.0000 | MEDICATED_PATCH | TRANSDERMAL | Status: DC
  Administered 2024-06-11 (×2): 1.5 mg via TRANSDERMAL

## 2024-06-11 MED ORDER — PHENYLEPHRINE HCL-NACL 20-0.9 MG/250ML-% IV SOLN
INTRAVENOUS | Status: DC | PRN
  Administered 2024-06-11 (×2): 50 ug/min via INTRAVENOUS

## 2024-06-11 MED ORDER — KETOROLAC TROMETHAMINE 30 MG/ML IJ SOLN
INTRAMUSCULAR | Status: DC | PRN
  Administered 2024-06-11 (×2): 30 mg via INTRAVENOUS

## 2024-06-11 MED ORDER — LIDOCAINE 2% (20 MG/ML) 5 ML SYRINGE
INTRAMUSCULAR | Status: AC
  Filled 2024-06-11: qty 5

## 2024-06-11 MED ORDER — SUGAMMADEX SODIUM 200 MG/2ML IV SOLN
INTRAVENOUS | Status: DC | PRN
  Administered 2024-06-11 (×2): 199.6 mg via INTRAVENOUS

## 2024-06-11 MED ORDER — DEXAMETHASONE SODIUM PHOSPHATE 10 MG/ML IJ SOLN
INTRAMUSCULAR | Status: AC
  Filled 2024-06-11: qty 1

## 2024-06-11 MED ORDER — SODIUM CHLORIDE 0.9 % IR SOLN
Status: DC | PRN
  Administered 2024-06-11 (×2): 1000 mL

## 2024-06-11 MED ORDER — ACETAMINOPHEN 500 MG PO TABS
ORAL_TABLET | ORAL | Status: AC
  Filled 2024-06-11: qty 2

## 2024-06-11 MED ORDER — ACETAMINOPHEN 500 MG PO TABS: 1000.0000 mg | ORAL_TABLET | Freq: Four times a day (QID) | ORAL | 1 refills | Status: AC | PRN

## 2024-06-11 MED ORDER — FENTANYL CITRATE (PF) 250 MCG/5ML IJ SOLN
INTRAMUSCULAR | Status: DC | PRN
  Administered 2024-06-11 (×2): 50 ug via INTRAVENOUS
  Administered 2024-06-11 (×2): 100 ug via INTRAVENOUS

## 2024-06-11 MED ORDER — OXYCODONE HCL 5 MG/5ML PO SOLN: 5.0000 mg | Freq: Once | ORAL | Status: AC | PRN

## 2024-06-11 MED ORDER — LACTATED RINGERS IV SOLN: INTRAVENOUS | Status: DC

## 2024-06-11 MED ORDER — DEXAMETHASONE SODIUM PHOSPHATE 10 MG/ML IJ SOLN
INTRAMUSCULAR | Status: DC | PRN
  Administered 2024-06-11 (×2): 10 mg via INTRAVENOUS

## 2024-06-11 MED ORDER — MIDAZOLAM HCL 2 MG/2ML IJ SOLN
INTRAMUSCULAR | Status: AC
Start: 2024-06-11 — End: 2024-06-11
  Filled 2024-06-11: qty 2

## 2024-06-11 MED ORDER — CHLORHEXIDINE GLUCONATE 0.12 % MT SOLN
OROMUCOSAL | Status: AC
  Filled 2024-06-11: qty 15

## 2024-06-11 MED ORDER — KETOROLAC TROMETHAMINE 30 MG/ML IJ SOLN: 30.0000 mg | Freq: Once | INTRAMUSCULAR | Status: DC | PRN

## 2024-06-11 MED ORDER — POVIDONE-IODINE 10 % EX SWAB: 2.0000 | Freq: Once | CUTANEOUS | Status: DC

## 2024-06-11 MED ORDER — CHLORHEXIDINE GLUCONATE 0.12 % MT SOLN
15.0000 mL | Freq: Once | OROMUCOSAL | Status: AC
  Administered 2024-06-11 (×2): 15 mL via OROMUCOSAL

## 2024-06-11 MED ORDER — GABAPENTIN 300 MG PO CAPS
300.0000 mg | ORAL_CAPSULE | ORAL | Status: AC
  Administered 2024-06-11 (×2): 300 mg via ORAL

## 2024-06-11 MED ORDER — ACETAMINOPHEN 500 MG PO TABS
1000.0000 mg | ORAL_TABLET | ORAL | Status: AC
  Administered 2024-06-11 (×2): 1000 mg via ORAL

## 2024-06-11 MED ORDER — BUPIVACAINE HCL 0.25 % IJ SOLN
INTRAMUSCULAR | Status: DC | PRN
  Administered 2024-06-11 (×2): 10 mL

## 2024-06-11 MED ORDER — ONDANSETRON HCL 4 MG/2ML IJ SOLN
INTRAMUSCULAR | Status: AC
  Filled 2024-06-11: qty 2

## 2024-06-11 MED ORDER — PROPOFOL 10 MG/ML IV BOLUS
INTRAVENOUS | Status: DC | PRN
  Administered 2024-06-11 (×2): 200 mg via INTRAVENOUS

## 2024-06-11 MED ORDER — AMISULPRIDE (ANTIEMETIC) 5 MG/2ML IV SOLN: 10.0000 mg | Freq: Once | INTRAVENOUS | Status: DC | PRN

## 2024-06-11 MED ORDER — BUPIVACAINE LIPOSOME 1.3 % IJ SUSP
INTRAMUSCULAR | Status: DC | PRN
  Administered 2024-06-11 (×2): 40 mL

## 2024-06-11 MED ORDER — ROCURONIUM BROMIDE 10 MG/ML (PF) SYRINGE
PREFILLED_SYRINGE | INTRAVENOUS | Status: DC | PRN
  Administered 2024-06-11: 60 mg via INTRAVENOUS
  Administered 2024-06-11: 40 mg via INTRAVENOUS
  Administered 2024-06-11: 60 mg via INTRAVENOUS
  Administered 2024-06-11: 40 mg via INTRAVENOUS

## 2024-06-11 MED ORDER — LIDOCAINE 2% (20 MG/ML) 5 ML SYRINGE
INTRAMUSCULAR | Status: DC | PRN
  Administered 2024-06-11 (×2): 60 mg via INTRAVENOUS

## 2024-06-11 MED ORDER — CEFAZOLIN SODIUM-DEXTROSE 2-4 GM/100ML-% IV SOLN
INTRAVENOUS | Status: AC
  Filled 2024-06-11: qty 100

## 2024-06-11 MED ORDER — LABETALOL HCL 5 MG/ML IV SOLN
INTRAVENOUS | Status: AC
Start: 2024-06-11 — End: 2024-06-11
  Filled 2024-06-11: qty 4

## 2024-06-11 MED ORDER — FENTANYL CITRATE (PF) 100 MCG/2ML IJ SOLN
INTRAMUSCULAR | Status: AC
Start: 2024-06-11 — End: 2024-06-11
  Filled 2024-06-11: qty 2

## 2024-06-11 MED ORDER — LIDOCAINE 2% (20 MG/ML) 5 ML SYRINGE
INTRAMUSCULAR | Status: AC
Start: 2024-06-11 — End: 2024-06-11
  Filled 2024-06-11: qty 5

## 2024-06-11 MED ORDER — SODIUM CHLORIDE 0.9 % IV SOLN: INTRAVENOUS | Status: DC | PRN

## 2024-06-11 MED ORDER — GABAPENTIN 300 MG PO CAPS
ORAL_CAPSULE | ORAL | Status: AC
  Filled 2024-06-11: qty 1

## 2024-06-11 MED ORDER — ROCURONIUM BROMIDE 10 MG/ML (PF) SYRINGE
PREFILLED_SYRINGE | INTRAVENOUS | Status: AC
Start: 2024-06-11 — End: 2024-06-11
  Filled 2024-06-11: qty 10

## 2024-06-11 MED ORDER — PROPOFOL 500 MG/50ML IV EMUL
INTRAVENOUS | Status: DC | PRN
  Administered 2024-06-11 (×2): 25 ug/kg/min via INTRAVENOUS

## 2024-06-11 MED ORDER — OXYCODONE HCL 5 MG PO TABS: 5.0000 mg | ORAL_TABLET | ORAL | 0 refills | Status: DC | PRN

## 2024-06-11 MED ORDER — SCOPOLAMINE 1 MG/3DAYS TD PT72
MEDICATED_PATCH | TRANSDERMAL | Status: AC
  Filled 2024-06-11: qty 1

## 2024-06-11 MED ORDER — NICOTINE 21 MG/24HR TD PT24: 21.0000 mg | MEDICATED_PATCH | Freq: Every day | TRANSDERMAL | 0 refills | Status: AC

## 2024-06-11 MED ORDER — OXYCODONE HCL 5 MG PO TABS
5.0000 mg | ORAL_TABLET | Freq: Once | ORAL | Status: AC | PRN
  Administered 2024-06-11 (×2): 5 mg via ORAL

## 2024-06-11 MED ORDER — MIDAZOLAM HCL 2 MG/2ML IJ SOLN
INTRAMUSCULAR | Status: DC | PRN
  Administered 2024-06-11 (×2): 2 mg via INTRAVENOUS

## 2024-06-11 MED ORDER — PHENYLEPHRINE 80 MCG/ML (10ML) SYRINGE FOR IV PUSH (FOR BLOOD PRESSURE SUPPORT)
PREFILLED_SYRINGE | INTRAVENOUS | Status: AC
  Filled 2024-06-11: qty 10

## 2024-06-11 SURGICAL SUPPLY — 50 items
APPLICATOR ARISTA FLEXITIP XL (MISCELLANEOUS) IMPLANT
COVER BACK TABLE 60X90IN (DRAPES) IMPLANT
COVER MAYO STAND STRL (DRAPES) ×2 IMPLANT
DEFOGGER SCOPE WARM SEASHARP (MISCELLANEOUS) ×2 IMPLANT
DERMABOND ADVANCED .7 DNX12 (GAUZE/BANDAGES/DRESSINGS) ×2 IMPLANT
DRAPE SURG IRRIG POUCH 19X23 (DRAPES) ×2 IMPLANT
DURAPREP 26ML APPLICATOR (WOUND CARE) ×2 IMPLANT
GLOVE BIO SURGEON STRL SZ7 (GLOVE) ×4 IMPLANT
GLOVE SURG UNDER POLY LF SZ7 (GLOVE) ×8 IMPLANT
GOWN STRL REUS W/ TWL XL LVL3 (GOWN DISPOSABLE) ×6 IMPLANT
HEMOSTAT ARISTA ABSORB 3G PWDR (HEMOSTASIS) IMPLANT
HIBICLENS CHG 4% 4OZ BTL (MISCELLANEOUS) ×4 IMPLANT
IRRIGATION SUCT STRKRFLW 2 WTP (MISCELLANEOUS) ×2 IMPLANT
KIT PINK PAD W/HEAD ARM REST (MISCELLANEOUS) ×2 IMPLANT
KIT TURNOVER KIT B (KITS) ×2 IMPLANT
LIGASURE VESSEL 5MM BLUNT TIP (ELECTROSURGICAL) ×2 IMPLANT
NDL INSUFFLATION 14GA 120MM (NEEDLE) ×2 IMPLANT
NDL SPNL 22GX3.5 QUINCKE BK (NEEDLE) ×2 IMPLANT
NEEDLE INSUFFLATION 14GA 120MM (NEEDLE) ×2 IMPLANT
NEEDLE SPNL 22GX3.5 QUINCKE BK (NEEDLE) ×2 IMPLANT
NS IRRIG 1000ML POUR BTL (IV SOLUTION) ×2 IMPLANT
OCCLUDER COLPOPNEUMO (BALLOONS) IMPLANT
PACK LAPAROSCOPY BASIN (CUSTOM PROCEDURE TRAY) ×2 IMPLANT
POUCH LAPAROSCOPIC INSTRUMENT (MISCELLANEOUS) ×2 IMPLANT
SCALPEL HRMNC RUM II 2.5 SILVR (DISPOSABLE) IMPLANT
SCALPEL HRMNC RUM II 3.0 SILVR (DISPOSABLE) IMPLANT
SCALPEL HRMNC RUM II 3.5 SILVR (DISPOSABLE) IMPLANT
SCALPEL HRMNC RUM II 4.0 SILVR (DISPOSABLE) IMPLANT
SCISSORS LAP 5X35 DISP (ENDOMECHANICALS) IMPLANT
SET CYSTO W/LG BORE CLAMP LF (SET/KITS/TRAYS/PACK) ×2 IMPLANT
SET TRI-LUMEN FLTR TB AIRSEAL (TUBING) ×2 IMPLANT
SHEARS HARMONIC 36 ACE (MISCELLANEOUS) IMPLANT
SLEEVE ADV FIXATION 5X100MM (TROCAR) ×2 IMPLANT
SUT VIC AB 0 CT1 27XBRD ANBCTR (SUTURE) IMPLANT
SUT VIC AB 4-0 PS2 18 (SUTURE) ×4 IMPLANT
SUT VLOC 180 0 9IN GS21 (SUTURE) ×2 IMPLANT
SYR 10ML LL (SYRINGE) ×2 IMPLANT
SYR 50ML LL SCALE MARK (SYRINGE) ×2 IMPLANT
SYR CONTROL 10ML LL (SYRINGE) ×2 IMPLANT
TIP RUMI ORANGE 6.7MMX12CM (TIP) IMPLANT
TIP UTERINE 6.7X10CM GRN DISP (MISCELLANEOUS) IMPLANT
TIP UTERINE 6.7X8CM BLUE DISP (MISCELLANEOUS) IMPLANT
TOWEL GREEN STERILE FF (TOWEL DISPOSABLE) ×4 IMPLANT
TRAY FOLEY W/BAG SLVR 14FR (SET/KITS/TRAYS/PACK) ×2 IMPLANT
TROCAR ADV FIXATION 5X100MM (TROCAR) ×2 IMPLANT
TROCAR PORT AIRSEAL 8X120 (TROCAR) ×2 IMPLANT
TROCAR XCEL NON BLADE 8MM B8LT (ENDOMECHANICALS) ×2 IMPLANT
TROCAR XCEL NON-BLD 5MMX100MML (ENDOMECHANICALS) ×2 IMPLANT
UNDERPAD 30X36 HEAVY ABSORB (UNDERPADS AND DIAPERS) ×2 IMPLANT
WARMER LAPAROSCOPE (MISCELLANEOUS) ×2 IMPLANT

## 2024-06-11 NOTE — Addendum Note (Signed)
 Addendum  created 06/11/24 2109 by Tilford Franky BIRCH, MD   Clinical Note Signed

## 2024-06-11 NOTE — Op Note (Signed)
 Kristin Dean PROCEDURE DATE: 06/11/2024  PREOPERATIVE DIAGNOSIS: abnormal uterine bleeding, fibroids POSTOPERATIVE DIAGNOSIS: abnormal uterine bleeding, fibroids PROCEDURE:    total laparoscopic hysterectomy, bilateral salpingectomy, cystoscopy and TAP block SURGEON: Carter Quarry, MD ASSISTANT:  Rollo Bring, MD    An experienced assistant was required given the standard of surgical care given the complexity of the case.  This assistant was needed for exposure, dissection, suctioning, retraction, instrument exchange, and for overall help during the procedure.  INDICATIONS: 53 y.o. [id number] with AUB-L.  Risks of surgery were discussed with the patient including but not limited to: bleeding which may require transfusion; infection which may require antibiotics; injury to surrounding organs; need for additional procedures including laparotomy;  and other postoperative/anesthesia complications. Written informed consent was obtained.    FINDINGS:  Normal external genitalia, 10 wk size mobile uterus with norma contours.  Laparoscopically: normal upper abdominal survey with omental adhesions to the umbilicus, enlarged uterus, surgical ligated bilateral fallopian tubes, normal bilateral ovaries, bilateral ureters seen, normal anterior cul de sac, normal posterior cul de sac Cystoscopically: normal bladder wall without apparent injury, bilateral ureteral orifices, fluorescein-stained urine from bilateral ureteral orifices   ANESTHESIA: General, paracervical block, TAP block INTRAVENOUS FLUIDS:  1000 ml of LR ESTIMATED BLOOD LOSS:  25 ml URINE OUTPUT: 100 ml SPECIMENS: uterus, cervix, bilateral fallopian tubes COMPLICATIONS:  None immediate.  PROCEDURE: The risks, benefits, and alternatives of surgery were explained, understood, and accepted. Consents were signed. All questions were answered. She was taken to the operating room and general anesthesia was applied without complication. She was  placed in the dorsal lithotomy position and her abdomen and vagina were prepped and draped after she had been carefully positioned on the table. A bimanual exam revealed a 10 week size uterus that was mobile. Her adnexa were not enlarged. A Foley catheter was placed and it drained clear throughout the case. A speculum was placed and the cervix visualized. Paracervical block was administered. The cervix was measured and the uterus was sounded to 9 cm. A Rumi uterine manipulator using a 3.5cm cup and 8cm tip was placed without difficulty.  Gloves were changed and attention was turned to the abdomen. All incisions were infiltrated with local anesthetic. A 5mm incision was made in the umbilicus and an optiview trocar was introduced into the abdomen. Initially entered within omentum and was able to maneuver the trocar from underneath it. The Entry was confirmed with low opening intraabdominal pressure and visualization and the abdomen was then insufflated. After good pneumoperitoneum was established, the abdomen was surveyed including the upper abdomen. She was placed in Trendelenburg position and ports was placed in the LLQ and the omental adhesion inspected. At this time, TAP block was completed under laparoscopic visualization. At this time, remaining trocars were placed in the RLQ and 8mm airseal trocar in the RUQ.   The right mesosalpinx was serially cauterized and cut and removed from the abdomen. The uteroovarian ligament was cauterized and divided using the ligasure device. The round ligament was divided. The leaves of the broad ligament were separated and the anterior leaf dissected down to the level of the Koh ring. The posterior leaf was dissected and the uterine vessels were skeletonized. The uterine vessels were cauterized, divided, and lateralized to the cup edge. Attention was turned to the left side where the same was completed. The bladder was pushed from the operative site and the tissue dissected  down to the pubocervical fascia using the harmonic scalpel device. Once adequately mobilized,  the colpotomy was completed using harmonic scalpel device and the uterus removed from the pelvis.    The vaginal cuff was then closed using 0 Vloc in a running fashion. The pelvis was irrigated. All pedicles were inspected. No bleeding was noted.   CO2 pressures were lowered to 7mm Hg.  Again, no bleeding was noted.  Ureters were noted deep in the pelvis to be peristalsing.  At this point the procedure was completed.  The remaining instruments were removed.  The patient was taken out of Trendelenburg positioning.    Attention was then turned the vagina and the cuff was inspected. No bleeding was noted. The Foley catheter was removed.  Cystoscopy was performed.  No sutures or bladder injuries were noted.  Ureters were noted with efflux of urine from bilateral ureteral orifices.  Foley was left out after the cystoscopic fluid was drained and cystoscope removed.  The skin was then closed with subcuticular stitches of 4-0 Vicryl. The skin was cleansed Dermabond was applied. Sponge, lap, needle, instrument counts were correct x2. Patient tolerated the procedure very well. She was awakened from anesthesia, extubated and taken to recovery in stable condition.    Carter Quarry, MD Minimally Invasive Gynecologic Surgery  Obstetrics and Gynecology, [facility] for [facility], [facility] 06/11/2024

## 2024-06-11 NOTE — Anesthesia Procedure Notes (Signed)
 Procedure Name: Intubation Date/Time: 06/11/2024 5:40 PM  Performed by: Zelphia Norleen HERO, CRNAPre-anesthesia Checklist: Patient identified, Emergency Drugs available, Suction available and Patient being monitored Patient Re-evaluated:Patient Re-evaluated prior to induction Oxygen Delivery Method: Circle system utilized Preoxygenation: Pre-oxygenation with 100% oxygen Induction Type: IV induction Ventilation: Mask ventilation without difficulty Laryngoscope Size: Mac and 3 Grade View: Grade I Tube type: Oral Tube size: 6.5 mm Number of attempts: 1 Airway Equipment and Method: Stylet and Oral airway Placement Confirmation: ETT inserted through vocal cords under direct vision, positive ETCO2 and breath sounds checked- equal and bilateral Secured at: 22 cm Tube secured with: Tape Dental Injury: Teeth and Oropharynx as per pre-operative assessment

## 2024-06-11 NOTE — Transfer of Care (Signed)
 Immediate Anesthesia Transfer of Care Note  Patient: Suzy JONELLE Pouch  Procedure(s) Performed: HYSTERECTOMY, TOTAL, LAPAROSCOPIC, WITH SALPINGECTOMY (Bilateral: Pelvis) CYSTOSCOPY (Bladder)  Patient Location: PACU  Anesthesia Type:General  Level of Consciousness: awake, alert , and oriented  Airway & Oxygen Therapy: Patient Spontanous Breathing  Post-op Assessment: Report given to RN and Post -op Vital signs reviewed and stable  Post vital signs: Reviewed and stable  Last Vitals:  Vitals Value Taken Time  BP 93/52 06/11/24 19:45  Temp 37.1 C 06/11/24 19:38  Pulse 85 06/11/24 19:49  Resp 18 06/11/24 19:49  SpO2 100 % 06/11/24 19:49  Vitals shown include unfiled device data.  Last Pain:  Vitals:   06/11/24 1131  TempSrc: Oral  PainSc: 0-No pain      Patients Stated Pain Goal: 5 (06/11/24 1131)  Complications: No notable events documented.

## 2024-06-11 NOTE — Brief Op Note (Signed)
 06/11/2024  7:28 PM  PATIENT:  Kristin Dean  53 y.o. female  PRE-OPERATIVE DIAGNOSIS:  Fibroids Abnormal uterine bleeding  POST-OPERATIVE DIAGNOSIS:  FibroidsAbnormal uterine bleeding  PROCEDURE:  Procedure(s): HYSTERECTOMY, TOTAL, LAPAROSCOPIC, WITH SALPINGECTOMY (Bilateral) CYSTOSCOPY (N/A)  SURGEON:  Surgeons and Role:    DEWAINE Jeralyn Crutch, MD - Primary    * Dunn, Rollo DASEN, MD - Assisting  PHYSICIAN ASSISTANT: n/a  ASSISTANTS: above   ANESTHESIA:   general, paracervical block, and TAP block  EBL:  25 mL   BLOOD ADMINISTERED:none  DRAINS: none   LOCAL MEDICATIONS USED:  BUPIVICAINE  and OTHER exparel   SPECIMEN:  Source of Specimen:  uterus, cervix, bilateral fallopian tubes  DISPOSITION OF SPECIMEN:  PATHOLOGY  COUNTS:  YES  TOURNIQUET:  * No tourniquets in log *  DICTATION: .Note written in EPIC  PLAN OF CARE: Discharge to home after PACU  PATIENT DISPOSITION:  PACU - hemodynamically stable.   Delay start of Pharmacological VTE agent (>24hrs) due to surgical blood loss or risk of bleeding: not applicable

## 2024-06-11 NOTE — H&P (Signed)
 OB/GYN Pre-Op History and Physical  Kristin Dean is a 53 y.o. [id number] presenting for TLH, BS, cysto. Ok with starting patch for tobacco use replacement. Discussed same day discharge. Has anxiety this AM due to mother dying in this hospital. .       Past Medical History:  Diagnosis Date   Abnormal uterine bleeding (AUB)    Anemia    Arthritis    knees   Chronic headaches    Dyspnea    06-07-2024 pt stated sob w/ stairs, long distance walk, but okay w/ household chores   GERD (gastroesophageal reflux disease)    06-07-2024 pt stated drinks water   Hypertension    (06-07-2024  pt no BLE edema taking her BP as prescribed or other symptoms)  recent ED visit 02-03-2024 uncontrolled HTN w/ increasing SOB & BLE edema, r/o for CHF, given lasix  and referred to PharmD for HTN medication   Pre-diabetes    SUI (stress urinary incontinence, female)    Wears glasses     Past Surgical History:  Procedure Laterality Date   TUBAL LIGATION Bilateral 1992   PPTL  (MAC anesthesia,  did not have epidural)    OB History  Gravida Para Term Preterm AB Living  2 2 2   2   SAB IAB Ectopic Multiple Live Births          # Outcome Date GA Lbr Len/2nd Weight Sex Type Anes PTL Lv  2 Term      Vag-Spont     1 Term      Vag-Spont       Social History   Socioeconomic History   Marital status: Legally Separated    Spouse name: Not on file   Number of children: Not on file   Years of education: Not on file   Highest education level: Not on file  Occupational History   Not on file  Tobacco Use   Smoking status: Every Day    Current packs/day: 1.00    Average packs/day: 1 pack/day for 0.6 years (0.6 ttl pk-yrs)    Types: Cigarettes    Start date: 2025   Smokeless tobacco: Never   Tobacco comments:    06-07-2024  smokes 1 pp3d,  started age 24  Vaping Use   Vaping status: Never Used  Substance and Sexual Activity   Alcohol use: Yes    Comment: occasional   Drug use: Yes    Types:  Marijuana    Comment: 06-07-2024  pt stated smokes marijuana daily   Sexual activity: Yes    Birth control/protection: Surgical  Other Topics Concern   Not on file  Social History Narrative   Not on file   Social Drivers of Health   Financial Resource Strain: Not on file  Food Insecurity: Not on file  Transportation Needs: Not on file  Physical Activity: Not on file  Stress: Not on file  Social Connections: Unknown (03/15/2022)   [facility]   Social Network    Social Network: Not on file    Family History  Problem Relation Age of Onset   Diabetes Mother    Arthritis Mother    Hypertension Father     Medications Prior to Admission  Medication Sig Dispense Refill Last Dose/Taking   DESVENLAFAXINE SUCCINATE ER PO Take by mouth daily at 8 pm. 50 GT for depression   06/10/2024   hydrochlorothiazide (HYDRODIURIL) 25 MG tablet Take 25 mg by mouth daily.   06/10/2024  Ibuprofen  (ADVIL ) 200 MG CAPS Take 2 capsules by mouth every 8 (eight) hours as needed.   Past Month   losartan  (COZAAR ) 50 MG tablet Take 1 tablet (50 mg total) by mouth daily. (Patient taking differently: Take 50 mg by mouth daily.) 30 tablet 0 06/10/2024   norethindrone  (AYGESTIN ) 5 MG tablet Take 2 tablets (10 mg total) by mouth daily. 60 tablet 2 06/10/2024   acetaminophen  (TYLENOL ) 500 MG tablet Take 1,000 mg by mouth every 6 (six) hours as needed.   More than a month    No Known Allergies  Review of Systems: Negative except for what is mentioned in HPI.     Physical Exam: Ht 5' 2 (1.575 m)   Wt 99.8 kg   LMP 04/25/2024 (Approximate)   BMI 40.24 kg/m  CONSTITUTIONAL: Well-developed, well-nourished and in no acute distress.  HENT:  Normocephalic, atraumatic, External right and left ear normal. Oropharynx is clear and moist EYES: Conjunctivae and EOM are normal. Pupils are equal, round, and reactive to light. No scleral icterus.  NECK: Normal range of motion, supple, no masses SKIN: Skin  is warm and dry. No rash noted. Not diaphoretic. No erythema. No pallor. NEUROLGIC: Alert and oriented to person, place, and time. Normal reflexes, muscle tone coordination. No cranial nerve deficit noted. PSYCHIATRIC: Normal mood and affect. Normal behavior. Normal judgment and thought content. RESPIRATORY: Normal effort PELVIC: Deferred   Pertinent Labs/Studies:   Results for orders placed or performed during the hospital encounter of 06/10/24 (from the past 72 hours)  Type and screen  [facility]     Status: None   Collection Time: 06/10/24 11:00 AM  Result Value Ref Range   ABO/RH(D) A POS    Antibody Screen NEG    Sample Expiration 06/24/2024,2359    Extend sample reason      NO TRANSFUSIONS OR PREGNANCY IN THE PAST 3 MONTHS Performed at [facility], [address] [place], [place] [address]   Basic metabolic panel per protocol     Status: Abnormal   Collection Time: 06/10/24 11:00 AM  Result Value Ref Range   Sodium 138 135 - 145 mmol/L   Potassium 4.2 3.5 - 5.1 mmol/L   Chloride 103 98 - 111 mmol/L   CO2 23 22 - 32 mmol/L   Glucose, Bld 113 (H) 70 - 99 mg/dL    Comment: Glucose reference range applies only to samples taken after fasting for at least 8 hours.   BUN 9 6 - 20 mg/dL   Creatinine, Ser 9.03 0.44 - 1.00 mg/dL   Calcium 9.2 8.9 - 89.6 mg/dL   GFR, Estimated >39 >39 mL/min    Comment: (NOTE) Calculated using the CKD-EPI Creatinine Equation (2021)    Anion gap 12 5 - 15    Comment: Performed at [facility], [address], [place], [place] [address]  CBC per protocol     Status: Abnormal   Collection Time: 06/10/24 11:00 AM  Result Value Ref Range   WBC 7.4 4.0 - 10.5 K/uL   RBC 5.73 (H) 3.87 - 5.11 MIL/uL   Hemoglobin 14.9 12.0 - 15.0 g/dL   HCT 54.3 63.9 - 53.9 %   MCV 79.6 (L) 80.0 - 100.0 fL   MCH 26.0 26.0 - 34.0 pg   MCHC 32.7 30.0 - 36.0 g/dL   RDW 83.0 (H) 88.4 - 84.4 %   Platelets 391 150 - 400 K/uL   nRBC 0.0 0.0  - 0.2 %  Comment: Performed at [facility], [address] [place], [place] [address]       Assessment and Plan :Kristin Dean is a 53 y.o. [id number] here for surgical management of AUB and dysmenorrhea.   Patient desires surgical management with TLH, BS, cysto.  The risks of surgery were discussed in detail with the patient including but not limited to: bleeding which may require transfusion or reoperation; infection which may require prolonged hospitalization or re-hospitalization and antibiotic therapy; injury to bowel, bladder, ureters and major vessels or other surrounding organs which may lead to other procedures; formation of adhesions; need for additional procedures including laparotomy or subsequent procedures secondary to intraoperative injury or abnormal pathology; thromboembolic phenomenon; incisional problems and other postoperative or anesthesia complications.  Patient was told that the likelihood that her condition and symptoms will be treated effectively with this surgical management was high; the postoperative expectations were also discussed in detail. The patient also understands the alternative treatment options which were discussed in full. All questions were answered.  She was told that she will be contacted by our surgical scheduler regarding the time and date of her surgery; routine preoperative instructions will be given to her by the preoperative nursing team.    Printed patient education handouts about the procedure were given to the patient to review at home.    Kristin Dean, M.D. Minimally Invasive Gynecologic Surgery and Pelvic Pain Specialist Attending Obstetrician & Gynecologist, [facility] [employer], [facility]

## 2024-06-11 NOTE — Discharge Instructions (Signed)
 Post Op Hysterectomy Instructions Please read the instructions below. Refer to these instructions for the next few weeks. These instructions provide you with general information on caring for yourself after surgery. Your caregiver may also give you specific instructions. While your treatment has been planned according to the most current medical practices available, unavoidable problems sometimes happen. If you have any problems or questions after you leave, please call your caregiver.  HOME CARE INSTRUCTIONS Healing will take time. You will have discomfort, tenderness, swelling and bruising at the operative site for a couple of weeks. This is normal and will get better as time goes on.  Only take over-the-counter or prescription medicines for pain, discomfort or fever as directed by your caregiver.  Do not take aspirin. It can cause bleeding.  Do not drive when taking pain medication.  Follow your caregiver's advice regarding diet, exercise, lifting, driving and general activities.  Resume your usual diet as directed and allowed.  Get plenty of rest and sleep.  Do not douche, use tampons, or have sexual intercourse until your caregiver gives you permission. .  Take your temperature if you feel hot or flushed.  You may shower today when you get home.  No tub bath for one week.   Do not drink alcohol until you are not taking any narcotic pain medications.  Try to have someone home with you for a week or two to help with the household activities.   Be careful over the next two to three weeks with any activities at home that involve lifting, pushing, or pulling.  Listen to your body--if something feels uncomfortable to do, then don't do it. Make sure you and your family understands everything about your operation and recovery.  Walking up stairs is fine. Do not sign any legal documents until you feel normal again.  Keep all your follow-up appointments as recommended by your caregiver.   PLEASE CALL  THE OFFICE IF: There is swelling, redness or increasing pain in the wound area.  Pus is coming from the wound.  You notice a bad smell from the wound or surgical dressing.  You have pain, redness and swelling from the intravenous site.  The wound is breaking open (the edges are not staying together).   You develop pain or bleeding when you urinate.  You develop abnormal vaginal discharge.  You have any type of abnormal reaction or develop an allergy to your medication.  You need stronger pain medication for your pain   SEEK IMMEDIATE MEDICAL CARE: You develop a temperature of 100.5 or higher.  You develop abdominal pain.  You develop chest pain.  You develop shortness of breath.  You pass out.  You develop pain, swelling or redness of your leg.  You develop heavy vaginal bleeding with or without blood clots.   MEDICATIONS: Restart your regular medications BUT wait one week before restarting all vitamins and mineral supplements Use Motrin 800mg  every 8 hours for the next several days.   Take Tylenol 1000mg  every 8 hours for the next several days Use oxycodone 5 mg every 4-6 hours. Taking motrin and tylenol should help reduce how often you use oxycodone.  You may use an over the counter stool softener like Colace or Dulcolax to help with starting a bowel movement.  You can also use miralax (polyethylene glycol). Start the day after you go home.  Warm liquids, fluids, and ambulation help too.  If you have not had a bowel movement in four days, you need to  call the office.

## 2024-06-11 NOTE — Anesthesia Postprocedure Evaluation (Addendum)
 Anesthesia Post Note  Patient: Kristin Dean  Procedure(s) Performed: HYSTERECTOMY, TOTAL, LAPAROSCOPIC, WITH SALPINGECTOMY (Bilateral: Pelvis) CYSTOSCOPY (Bladder)     Patient location during evaluation: PACU Anesthesia Type: General Level of consciousness: awake and alert Pain management: pain level controlled Vital Signs Assessment: post-procedure vital signs reviewed and stable Respiratory status: spontaneous breathing, nonlabored ventilation, respiratory function stable and patient connected to nasal cannula oxygen Cardiovascular status: blood pressure returned to baseline and stable Postop Assessment: no apparent nausea or vomiting Anesthetic complications: no   No notable events documented.  Last Vitals:  Vitals:   06/11/24 2015 06/11/24 2030  BP: 130/70 123/67  Pulse: 73 72  Resp: 13 17  Temp:  37.1 C  SpO2: 100% 100%                 Kristin Dean

## 2024-06-11 NOTE — Anesthesia Preprocedure Evaluation (Addendum)
 Anesthesia Evaluation  Patient identified by MRN, date of birth, ID band Patient awake    Reviewed: Allergy & Precautions, NPO status , Patient's Chart, lab work & pertinent test results  Airway Mallampati: II  TM Distance: >3 FB Neck ROM: Full    Dental no notable dental hx.    Pulmonary Current Smoker and Patient abstained from smoking.   Pulmonary exam normal        Cardiovascular hypertension, Pt. on medications Normal cardiovascular exam     Neuro/Psych  Headaches  negative psych ROS   GI/Hepatic negative GI ROS,,,(+)     substance abuse    Endo/Other    Class 3 obesity  Renal/GU negative Renal ROS     Musculoskeletal   Abdominal  (+) + obese  Peds  Hematology negative hematology ROS (+)   Anesthesia Other Findings Fibroids  Abnormal uterine bleeding    Reproductive/Obstetrics                              Anesthesia Physical Anesthesia Plan  ASA: 3  Anesthesia Plan: General   Post-op Pain Management:    Induction: Intravenous  PONV Risk Score and Plan: 3 and Ondansetron , Dexamethasone , Midazolam , Scopolamine  patch - Pre-op and Treatment may vary due to age or medical condition  Airway Management Planned: Oral ETT  Additional Equipment:   Intra-op Plan:   Post-operative Plan: Extubation in OR  Informed Consent: I have reviewed the patients History and Physical, chart, labs and discussed the procedure including the risks, benefits and alternatives for the proposed anesthesia with the patient or authorized representative who has indicated his/her understanding and acceptance.     Dental advisory given  Plan Discussed with: CRNA  Anesthesia Plan Comments:          Anesthesia Quick Evaluation

## 2024-06-12 ENCOUNTER — Encounter (HOSPITAL_COMMUNITY): Payer: Self-pay | Admitting: Obstetrics and Gynecology

## 2024-06-13 LAB — SURGICAL PATHOLOGY

## 2024-06-17 ENCOUNTER — Ambulatory Visit: Payer: Self-pay | Admitting: Obstetrics and Gynecology

## 2024-06-25 ENCOUNTER — Ambulatory Visit (INDEPENDENT_AMBULATORY_CARE_PROVIDER_SITE_OTHER): Admitting: Obstetrics and Gynecology

## 2024-06-25 ENCOUNTER — Encounter: Payer: Self-pay | Admitting: Obstetrics and Gynecology

## 2024-06-25 VITALS — BP 133/81 | HR 78 | Ht 62.0 in | Wt 213.2 lb

## 2024-06-25 DIAGNOSIS — G8918 Other acute postprocedural pain: Secondary | ICD-10-CM

## 2024-06-25 DIAGNOSIS — Z09 Encounter for follow-up examination after completed treatment for conditions other than malignant neoplasm: Secondary | ICD-10-CM

## 2024-06-25 DIAGNOSIS — F4321 Adjustment disorder with depressed mood: Secondary | ICD-10-CM

## 2024-06-25 MED ORDER — IBUPROFEN 800 MG PO TABS: 800.0000 mg | ORAL_TABLET | Freq: Three times a day (TID) | ORAL | 3 refills | Status: AC | PRN

## 2024-06-25 NOTE — Progress Notes (Signed)
 POSTOPERATIVE VISIT NOTE   Subjective:     Kristin Dean is a 53 y.o. [id number] who presents to the clinic 2 weeks status post TLH, BS, cysto for abnormal uterine bleeding, fibroids, and pelvic pain. Eating a regular diet without difficulty. Bowel movements are normal. Pain is controlled with current analgesics. Medications being used: acetaminophen  and ibuprofen  (OTC). Incision: intermittently burn; slight burning sensation and uncomfortable laying on side and abdomen  Vaginal bleeding: none Resumed sexual acitivity: no - wondering when sexual activity can be resumed    Taking tylenol /advil  combo and oxy and able to sleep at night   The following portions of the patient's history were reviewed and updated as appropriate: allergies, current medications, past family history, past medical history, past social history, past surgical history, and problem list..   Review of Systems Pertinent items are noted in HPI.    Objective:    BP 133/81   Pulse 78   Ht 5' 2 (1.575 m)   Wt 213 lb 3.2 oz (96.7 kg)   LMP 04/25/2024 (Approximate)   BMI 38.99 kg/m  General:  alert, cooperative, and no distress  Abdomen: soft, moderate tenderness around umbilicus  Incision:   healing well, no drainage, no erythema, no hernia, no seroma, no swelling, no dehiscence, incision well approximated  Pelvic:   Exam deferred.    Pathology Results: FINAL MICROSCOPIC DIAGNOSIS:   A. UTERUS WITH CERVIX AND FALLOPIAN TUBES, BILATERAL, HYSTERECTOMY:  Cervix:           Unremarkable.            Negative for dysplasia or malignancy.        Endocervix:            Nabothian cysts.            Negative for hyperplasia, atypia or malignancy.        Endometrium:            Benign endometrial polyp.  Benign late secretory endometrium.            Negative for hyperplasia, atypia or malignancy.        Myometrium:            Leiomyomata, multiple.            Negative for malignancy.        Serosa:             Unremarkable.            Negative for malignancy.        Bilateral fallopian tubes:            Benign fimbriated fallopian tubes.            Negative for malignancy.    Assessment:   Doing well postoperatively. Operative findings again reviewed. Pathology report discussed.   Plan:    1. Postop check (Primary) Reviewed intraoperative photos and pathology Emphasized importance of pelvic rest, particularly in the case of tobacco use   2. Postoperative pain Increase ibuprofen  for postoperative pain. Continue prn miralax for constipation.  - ibuprofen  (ADVIL ) 800 MG tablet; Take 1 tablet (800 mg total) by mouth 3 (three) times daily with meals as needed for headache, moderate pain (pain score 4-6) or cramping.  Dispense: 60 tablet; Refill: 3  3. Grief Offered and accepted IBH referral to discuss difficulties with accepting help from social support as well as no longer having her mother to talk to about her struggles.  - Amb ref  to [facility]  Activity restrictions: no lifting more than 10 pounds and pelvic rest at least 12 additional weeks Anticipated return to work: with restrictions: .no heavy lifting for 6 weeks. Follow up: at 8 weeks  Carter Quarry, MD Obstetrician & Gynecologist, [facility] for [facility], [facility]

## 2024-06-25 NOTE — Progress Notes (Signed)
 Pt has questions about medication; desvenlafaxine succinate, when to start med, etc.   Pt still having pain on right side (scale of 8 out of 10), states she can't sleep.   Pt states she was constipated for 5 days, but no longer.   Pt denies bleeding.

## 2024-08-07 ENCOUNTER — Encounter: Payer: Self-pay | Admitting: Physician Assistant

## 2024-08-07 ENCOUNTER — Ambulatory Visit (INDEPENDENT_AMBULATORY_CARE_PROVIDER_SITE_OTHER): Admitting: Physician Assistant

## 2024-08-07 VITALS — BP 143/92 | HR 79 | Ht 62.0 in | Wt 212.8 lb

## 2024-08-07 DIAGNOSIS — Z09 Encounter for follow-up examination after completed treatment for conditions other than malignant neoplasm: Secondary | ICD-10-CM

## 2024-08-07 DIAGNOSIS — G8918 Other acute postprocedural pain: Secondary | ICD-10-CM

## 2024-08-07 NOTE — Progress Notes (Signed)
 Pt presents for f/u. Pain today 3/10; itching at incision sites. BP elevated x2; took meds at 7am.

## 2024-08-07 NOTE — Progress Notes (Signed)
   POSTOPERATIVE VISIT NOTE   Subjective:     Kristin Dean is a 53 y.o. [id number] who presents to the clinic 8 weeks status post TLH, BS, cystoscopy for management of AUB, fibroids, and pelvic pain.  Eating a regular diet without difficulty. Bowel movements are normal. Pain is controlled with current analgesics. Medications being used: ibuprofen  (OTC). Incision: RUQ incision burns periodically. She has been applying vitamin E ointment to prevent incision site itching.  Vaginal bleeding: None Resumed sexual acitivity: No   The following portions of the patient's history were reviewed and updated as appropriate: allergies, current medications, past family history, past medical history, past social history, past surgical history, and problem list..   Review of Systems Pertinent items are noted in HPI.    Objective:    BP (!) 143/92   Pulse 79   Ht 5' 2 (1.575 m)   Wt 212 lb 12.8 oz (96.5 kg)   LMP 04/25/2024 (Approximate)   BMI 38.92 kg/m  General:  alert, cooperative, and appears stated age  Abdomen: soft, bowel sounds active, non-tender.   Incision:   Incisions well approximated without drainage, no erythema, no hernia, no seroma, no swelling, no dehiscence  Pelvic:   Vaginal cuff healing well without separation, erythema, induration, drainage. Minimal granulation tissue noted. No vaginal bleeding or abnormal discharge.       Assessment:   Doing well postoperatively. Pathology report discussed.   Plan:    1. Postop check (Primary) Vaginal cuff healing well without evidence inflammation or infection Patient to continue using topical moisturizer for itching at incision sites  2. Postoperative pain Pain well controlled on current regimen. Continue taking ibuprofen  as needed.    Activity restrictions: Pelvic rest at least 6 additional weeks Anticipated return to work: now. Follow up: 6 weeks  Jorene Moats, PA-C [facility] for [employer], [facility]

## 2024-09-20 ENCOUNTER — Encounter: Payer: Self-pay | Admitting: Physician Assistant

## 2024-09-20 ENCOUNTER — Ambulatory Visit (INDEPENDENT_AMBULATORY_CARE_PROVIDER_SITE_OTHER): Admitting: Physician Assistant

## 2024-09-20 VITALS — BP 115/81 | HR 76 | Ht 62.5 in | Wt 216.0 lb

## 2024-09-20 DIAGNOSIS — N393 Stress incontinence (female) (male): Secondary | ICD-10-CM

## 2024-09-20 DIAGNOSIS — Z09 Encounter for follow-up examination after completed treatment for conditions other than malignant neoplasm: Secondary | ICD-10-CM

## 2024-09-20 NOTE — Progress Notes (Unsigned)
 Pt presents for f/u. Pt reports pain is 0/10. Reports itching at incision site.

## 2024-09-20 NOTE — Progress Notes (Unsigned)
   POSTOPERATIVE VISIT NOTE   Subjective:     Kristin Dean is a 53 y.o. [id number] who presents to the clinic 14 weeks status post TLH, BO, cystoscopy for fibroids, abnormal uterine bleeding. Eating a regular diet without difficulty. Bowel movements are normal. The patient is not having any pain. Incisions: Healing well, mild itching remains at one site Vaginal bleeding: None Resumed sexual acitivity: Yes Additional concerns: Urinary incontinence with coughing   The following portions of the patient's history were reviewed and updated as appropriate: allergies, current medications, past family history, past medical history, past social history, past surgical history, and problem list..   Review of Systems Pertinent items noted in HPI and remainder of comprehensive ROS otherwise negative.    Objective:    BP 115/81   Pulse 76   Ht 5' 2.5 (1.588 m)   Wt 216 lb (98 kg)   LMP 04/25/2024 (Approximate)   BMI 38.88 kg/m  General:  alert, cooperative, and appears stated age  Abdomen: soft, bowel sounds active, non-tender  Incision:   healing well, no drainage, no erythema, no hernia, no seroma, no swelling, no dehiscence, incision well approximated  Pelvic:   Vaginal cuff healing well without separation, erythema, induration, drainage. No granulation tissue noted. No vaginal bleeding.      Assessment:   Doing well postoperatively. Operative findings again reviewed. Pathology report discussed.   Plan:    1. Postop check (Primary) Patient doing well, in no pain, using no analgesics. This is her third post-op visit due to tobacco use and extended time needed for incisions and cuff to heal. Abdominal incisions and vaginal cuff have healed well.   2. Stress incontinence of urine - Ambulatory referral to Urogynecology   Activity restrictions: None Anticipated return to work: now Follow up: Patient released from perioperative care and may follow-up as needed  Jorene Moats,  PA-C [facility] [employer], [facility] [facility]

## 2024-09-21 ENCOUNTER — Encounter (HOSPITAL_COMMUNITY): Payer: Self-pay

## 2024-09-21 ENCOUNTER — Ambulatory Visit (INDEPENDENT_AMBULATORY_CARE_PROVIDER_SITE_OTHER)

## 2024-09-21 ENCOUNTER — Ambulatory Visit (HOSPITAL_COMMUNITY): Admission: EM | Admit: 2024-09-21 | Discharge: 2024-09-21 | Disposition: A | Discharge status: Home / Self Care

## 2024-09-21 DIAGNOSIS — R059 Cough, unspecified: Secondary | ICD-10-CM

## 2024-09-21 DIAGNOSIS — J040 Acute laryngitis: Secondary | ICD-10-CM | POA: Diagnosis not present

## 2024-09-21 MED ORDER — BENZONATATE 200 MG PO CAPS: 200.0000 mg | ORAL_CAPSULE | Freq: Three times a day (TID) | ORAL | 0 refills | Status: AC

## 2024-09-21 MED ORDER — DEXAMETHASONE SOD PHOSPHATE PF 10 MG/ML IJ SOLN
10.0000 mg | Freq: Once | INTRAMUSCULAR | Status: AC
  Administered 2024-09-21: 10 mg via INTRAMUSCULAR

## 2024-09-21 NOTE — ED Triage Notes (Signed)
 Patient loss her voice 2 days ago. Patient has a cough. States she does smoke. Denies any known sick exposure.   Patient tried sunflower honey, lemon, recola cough drops, and thera flu night time with little relief.

## 2024-09-21 NOTE — Discharge Instructions (Signed)
 Chest x-ray did not reveal active infection.  I suspect you have laryngitis from a postviral cough.  Sometimes these can last up to 6 weeks or so.  We have given you an injection of steroids to help with the inflammation in your lymph nodes.  Use the Tessalon  Perles as needed to help with cough suppression.  Continue warm saline gargles, tea with honey and over-the-counter cough drops.  I suggest voice rest for the next few days to heal your voice.  Please cut back on smoking as well, as this will further irritate your laryngitis.  Symptoms should improve the next week or so.  If no improvement or any changes follow-up with your primary care provider for reevaluation.

## 2024-09-21 NOTE — ED Provider Notes (Signed)
 [facility]    CSN: [id number] Arrival date & time: 09/21/24  1411      History   Chief Complaint Chief Complaint  Patient presents with   Hoarse    HPI Kristin Dean is a 53 y.o. female.   Patient presents to clinic over concern of voice loss for the past 2 days.  Reports she had a cold about a month ago and has been coughing ever since.  Cough is productive with mucus.  Endorses wheezing and shortness of breath.  Reports throat pain when speaking.  Denies fever, body aches or chills.  At home she has tried sunflower honey, lemon, cough drops, TheraFlu and multiple other home interventions without any relief of her cough.  Recently diagnosed with type 2 diabetes, reports she is treating this with lifestyle interventions.  Has been eating more fruits and vegetables.  Does have a Dr Nunzio in the room with her.  Patient reports smoking around half a pack of cigarettes daily.  Does smoke 1.5 blunts of marijuana daily.  Does use cocaine daily as well.  The history is provided by the patient and medical records.    Past Medical History:  Diagnosis Date   Abnormal uterine bleeding (AUB)    Anemia    Arthritis    knees   Chronic headaches    Dyspnea    06-07-2024 pt stated sob w/ stairs, long distance walk, but okay w/ household chores   GERD (gastroesophageal reflux disease)    06-07-2024 pt stated drinks water   Hypertension    (06-07-2024  pt no BLE edema taking her BP as prescribed or other symptoms)  recent ED visit 02-03-2024 uncontrolled HTN w/ increasing SOB & BLE edema, r/o for CHF, given lasix  and referred to PharmD for HTN medication   Pre-diabetes    SUI (stress urinary incontinence, female)    Wears glasses     Patient Active Problem List   Diagnosis Date Noted   Abnormal uterine bleeding 06/11/2024   Intramural leiomyoma of uterus 06/11/2024    Past Surgical History:  Procedure Laterality Date   CYSTOSCOPY N/A 06/11/2024   Procedure:  CYSTOSCOPY;  Surgeon: Jeralyn Crutch, MD;  Location: [facility] OR;  Service: Gynecology;  Laterality: N/A;   TOTAL LAPAROSCOPIC HYSTERECTOMY WITH SALPINGECTOMY Bilateral 06/11/2024   Procedure: HYSTERECTOMY, TOTAL, LAPAROSCOPIC, WITH SALPINGECTOMY;  Surgeon: Jeralyn Crutch, MD;  Location: [facility] OR;  Service: Gynecology;  Laterality: Bilateral;   TUBAL LIGATION Bilateral 1992   PPTL  (MAC anesthesia,  did not have epidural)    OB History     Gravida  2   Para  2   Term  2   Preterm      AB      Living  2      SAB      IAB      Ectopic      Multiple      Live Births               Home Medications    Prior to Admission medications   Medication Sig Start Date End Date Taking? Authorizing Provider  acetaminophen  (TYLENOL ) 500 MG tablet Take 2 tablets (1,000 mg total) by mouth every 6 (six) hours as needed. 06/11/24  Yes Ajewole, Christana, MD  benzonatate  (TESSALON ) 200 MG capsule Take 1 capsule (200 mg total) by mouth every 8 (eight) hours. 09/21/24  Yes Yu Peggs  N, FNP  desvenlafaxine (PRISTIQ) 50 MG 24 hr tablet Take 50  mg by mouth every morning. 07/12/24  Yes [provider]  hydrochlorothiazide (HYDRODIURIL) 25 MG tablet Take 25 mg by mouth daily. 02/07/24  Yes [provider]  ibuprofen  (ADVIL ) 800 MG tablet Take 1 tablet (800 mg total) by mouth 3 (three) times daily with meals as needed for headache, moderate pain (pain score 4-6) or cramping. 06/25/24  Yes Ajewole, Christana, MD  losartan  (COZAAR ) 50 MG tablet Take 1 tablet (50 mg total) by mouth daily. 02/03/24  Yes Charlyn Sora, MD  nicotine  (NICODERM CQ  - DOSED IN MG/24 HOURS) 21 mg/24hr patch Place 1 patch (21 mg total) onto the skin daily. 06/11/24   Jeralyn Crutch, MD    Family History Family History  Problem Relation Age of Onset   Diabetes Mother    Arthritis Mother    Hypertension Father     Social History Social History   Tobacco Use   Smoking status: Every Day     Current packs/day: 1.00    Average packs/day: 1 pack/day for 0.9 years (0.9 ttl pk-yrs)    Types: Cigarettes    Start date: 2025   Smokeless tobacco: Never   Tobacco comments:    06-07-2024  smokes 1 pp3d,  started age 31  Vaping Use   Vaping status: Never Used  Substance Use Topics   Alcohol use: Yes    Comment: occasional   Drug use: Yes    Types: Marijuana    Comment: 06-07-2024  pt stated smokes marijuana daily     Allergies   Patient has no known allergies.   Review of Systems Review of Systems  Per HPI  Physical Exam Triage Vital Signs ED Triage Vitals  Encounter Vitals Group     BP 09/21/24 1514 121/81     Girls Systolic BP Percentile --      Girls Diastolic BP Percentile --      Boys Systolic BP Percentile --      Boys Diastolic BP Percentile --      Pulse Rate 09/21/24 1514 70     Resp 09/21/24 1514 18     Temp 09/21/24 1514 98.7 F (37.1 C)     Temp Source 09/21/24 1514 Oral     SpO2 09/21/24 1514 94 %     Weight 09/21/24 1514 216 lb (98 kg)     Height 09/21/24 1514 5' 2.5 (1.588 m)     Head Circumference --      Peak Flow --      Pain Score 09/21/24 1512 0     Pain Loc --      Pain Education --      Exclude from Growth Chart --    No data found.  Updated Vital Signs BP 121/81 (BP Location: Left Arm)   Pulse 70   Temp 98.7 F (37.1 C) (Oral)   Resp 18   Ht 5' 2.5 (1.588 m)   Wt 216 lb (98 kg)   LMP 04/25/2024 (Approximate)   SpO2 94%   BMI 38.88 kg/m   Visual Acuity Right Eye Distance:   Left Eye Distance:   Bilateral Distance:    Right Eye Near:   Left Eye Near:    Bilateral Near:     Physical Exam Vitals and nursing note reviewed.  Constitutional:      Appearance: Normal appearance.  HENT:     Head: Normocephalic and atraumatic.     Right Ear: External ear normal.     Left Ear: External ear normal.  Nose: Nose normal.     Mouth/Throat:     Mouth: Mucous membranes are moist.  Eyes:     Conjunctiva/sclera:  Conjunctivae normal.  Cardiovascular:     Rate and Rhythm: Normal rate and regular rhythm.     Heart sounds: Normal heart sounds. No murmur heard. Pulmonary:     Effort: Pulmonary effort is normal. No respiratory distress.     Breath sounds: Normal breath sounds. No wheezing.  Musculoskeletal:        General: Normal range of motion.  Skin:    General: Skin is warm and dry.  Neurological:     General: No focal deficit present.     Mental Status: She is alert and oriented to person, place, and time.  Psychiatric:        Mood and Affect: Mood normal.        Behavior: Behavior normal. Behavior is cooperative.      UC Treatments / Results  Labs (all labs ordered are listed, but only abnormal results are displayed) Labs Reviewed - No data to display  EKG   Radiology DG Chest 2 View Result Date: 09/21/2024 CLINICAL DATA:  Cough, wheezing, and shortness of breath. EXAM: CHEST - 2 VIEW COMPARISON:  02/02/2024. FINDINGS: The heart size and mediastinal contours are within normal limits. No consolidation, effusion, or pneumothorax is seen. No acute osseous abnormality. IMPRESSION: No active cardiopulmonary disease. Electronically Signed   By: Leita Birmingham M.D.   On: 09/21/2024 16:06    Procedures Procedures (including critical care time)  Medications Ordered in UC Medications  dexamethasone  (DECADRON ) injection 10 mg (has no administration in time range)    Initial Impression / Assessment and Plan / UC Course  I have reviewed the triage vital signs and the nursing notes.  Pertinent labs & imaging results that were available during my care of the patient were reviewed by me and considered in my medical decision making (see chart for details).  Vitals and triage reviewed, patient is hemodynamically stable.  Voice loss present.  Lungs vesicular, heart with regular rate and rhythm.  Chest x-ray by my interpretation does not show acute infiltrate, confirmed with radiology overread.   Suspect postviral cough, could be exacerbated by extensive smoking, causing laryngitis.  IM steroid given in clinic to help with inflammation.  Cough management discussed and voice rest encouraged.  Plan of care, follow-up care return precautions given, no questions at this time.  Work note provided.     Final Clinical Impressions(s) / UC Diagnoses   Final diagnoses:  Cough, unspecified type  Laryngitis     Discharge Instructions      Chest x-ray did not reveal active infection.  I suspect you have laryngitis from a postviral cough.  Sometimes these can last up to 6 weeks or so.  We have given you an injection of steroids to help with the inflammation in your lymph nodes.  Use the Tessalon  Perles as needed to help with cough suppression.  Continue warm saline gargles, tea with honey and over-the-counter cough drops.  I suggest voice rest for the next few days to heal your voice.  Please cut back on smoking as well, as this will further irritate your laryngitis.  Symptoms should improve the next week or so.  If no improvement or any changes follow-up with your primary care provider for reevaluation.      ED Prescriptions     Medication Sig Dispense Auth. Provider   benzonatate  (TESSALON ) 200 MG capsule Take  1 capsule (200 mg total) by mouth every 8 (eight) hours. 30 capsule Dreama, Sahand Gosch  N, FNP      PDMP not reviewed this encounter.   Dreama, Kalayla Shadden  N, FNP 09/21/24 [phone]

## 2024-11-25 ENCOUNTER — Emergency Department (HOSPITAL_BASED_OUTPATIENT_CLINIC_OR_DEPARTMENT_OTHER)
Admission: EM | Admit: 2024-11-25 | Discharge: 2024-11-25 | Disposition: A | Discharge status: Home / Self Care | Attending: Emergency Medicine | Admitting: Emergency Medicine

## 2024-11-25 ENCOUNTER — Encounter (HOSPITAL_BASED_OUTPATIENT_CLINIC_OR_DEPARTMENT_OTHER): Payer: Self-pay | Admitting: Emergency Medicine

## 2024-11-25 ENCOUNTER — Other Ambulatory Visit: Payer: Self-pay

## 2024-11-25 DIAGNOSIS — H6123 Impacted cerumen, bilateral: Secondary | ICD-10-CM | POA: Diagnosis present

## 2024-11-25 MED ORDER — CARBAMIDE PEROXIDE 6.5 % OT SOLN: 5.0000 [drp] | Freq: Once | OTIC | Status: DC

## 2024-11-25 NOTE — Discharge Instructions (Addendum)
 Debrox--medication to help soften the earwax  Make sure to follow-up outpatient, return for any worsening symptoms

## 2024-11-25 NOTE — ED Triage Notes (Signed)
 Patient here from home w/ complaints of feeling like her left ear is impacted- not sure if its full of fluid or if she pressed wax too far into the canal trying to clean it out. Reports of muffled hearing. Painful to the touch. Has tried cleaning ear out with peroxide. Denies drainage to the ear.

## 2024-11-25 NOTE — ED Provider Notes (Signed)
 "  EMERGENCY DEPARTMENT AT [facility] Provider Note   CSN: [id number] Arrival date & time: 11/25/24  8197     Patient presents with: Ear Fullness   Kristin Dean is a 54 y.o. female here for evaluation of muffled hearing to the left ear.  Yesterday was cleaning her ears out with a Q-tip and when she was done felt like her Q-tip went into for and she has had muffled hearing to the ear and some pressure sensation.  No drainage from ear.  Tried using peroxide at home without relief.  Has had impacted cerumen previously.  No bleeding from ears.   HPI     Prior to Admission medications  Medication Sig Start Date End Date Taking? Authorizing Provider  acetaminophen  (TYLENOL ) 500 MG tablet Take 2 tablets (1,000 mg total) by mouth every 6 (six) hours as needed. 06/11/24   Ajewole, Christana, MD  benzonatate  (TESSALON ) 200 MG capsule Take 1 capsule (200 mg total) by mouth every 8 (eight) hours. 09/21/24   Ball, [place]  G, FNP  desvenlafaxine (PRISTIQ) 50 MG 24 hr tablet Take 50 mg by mouth every morning. 07/12/24   [provider]  hydrochlorothiazide (HYDRODIURIL) 25 MG tablet Take 25 mg by mouth daily. 02/07/24   [provider]  ibuprofen  (ADVIL ) 800 MG tablet Take 1 tablet (800 mg total) by mouth 3 (three) times daily with meals as needed for headache, moderate pain (pain score 4-6) or cramping. 06/25/24   Ajewole, Christana, MD  losartan  (COZAAR ) 50 MG tablet Take 1 tablet (50 mg total) by mouth daily. 02/03/24   Nanavati, Ankit, MD  nicotine  (NICODERM CQ  - DOSED IN MG/24 HOURS) 21 mg/24hr patch Place 1 patch (21 mg total) onto the skin daily. 06/11/24   Ajewole, Christana, MD    Allergies: Patient has no known allergies.    Review of Systems  Constitutional: Negative.   HENT:  Positive for ear pain and hearing loss. Negative for congestion, dental problem, ear discharge and facial swelling (muffled hearing).   Respiratory: Negative.    Cardiovascular:  Negative.   Neurological:  Negative for headaches.  All other systems reviewed and are negative.   Updated Vital Signs BP 120/88   Pulse 78   Temp 98.4 F (36.9 C)   Resp 12   Ht 5' 2 (1.575 m)   Wt 98 kg   LMP 04/25/2024   SpO2 98%   BMI 39.52 kg/m   Physical Exam Vitals and nursing note reviewed.  Constitutional:      General: She is not in acute distress.    Appearance: She is well-developed. She is not ill-appearing.  HENT:     Head: Atraumatic.     Jaw: There is normal jaw occlusion.     Ears:     Comments: Impacted cerumen bilaterally.  Mastoids clear bilaterally.  No middle ear edema, drainage.  No obvious foreign object Eyes:     Pupils: Pupils are equal, round, and reactive to light.  Cardiovascular:     Rate and Rhythm: Normal rate.  Pulmonary:     Effort: No respiratory distress.  Abdominal:     General: There is no distension.  Musculoskeletal:        General: Normal range of motion.     Cervical back: Normal range of motion.  Skin:    General: Skin is warm and dry.  Neurological:     General: No focal deficit present.     Mental Status: She is alert.  Psychiatric:        Mood and Affect: Mood normal.     (all labs ordered are listed, but only abnormal results are displayed) Labs Reviewed - No data to display  EKG: None  Radiology: No results found.   Ear Cerumen Removal  Date/Time: 11/25/2024 7:59 PM  Performed by: Edie Rosebud LABOR, PA-C Authorized by: Edie Rosebud LABOR, PA-C   Consent:    Consent obtained:  Verbal   Consent given by:  Patient   Risks, benefits, and alternatives were discussed: yes     Risks discussed:  Bleeding, infection, pain, dizziness, incomplete removal and TM perforation   Alternatives discussed:  No treatment, delayed treatment, alternative treatment, observation and referral Universal protocol:    Procedure explained and questions answered to patient or proxy's satisfaction: yes     Relevant documents  present and verified: yes     Test results available: yes     Imaging studies available: yes     Required blood products, implants, devices, and special equipment available: yes     Site/side marked: yes     Patient identity confirmed:  Verbally with patient Procedure details:    Location:  L ear and R ear   Procedure type: irrigation     Procedure outcomes: cerumen removed (Some removed however persistent cerumen)   Post-procedure details:    Inspection:  Macerated skin and some cerumen remaining   Hearing quality:  Improved   Procedure completion:  Tolerated well, no immediate complications    Medications Ordered in the ED - No data to display  54 year old here for evaluation of muffled hearing and pressure to her left ear after using Q-tips yesterday.  Here she has impacted cerumen bilaterally.  Her mastoids are clear.  She has no middle ear edema, drainage.  No obvious foreign object.  Will plan on attempt at cerumen removal  Cerumen removed however still has some cerumen in bilateral canals.  Macerated skin to left middle ear  Discussed Debrox at home, ENT follow-up, return for new or worsening symptoms.                                  Medical Decision Making Amount and/or Complexity of Data Reviewed External Data Reviewed: labs, radiology and notes.  Risk OTC drugs. Decision regarding hospitalization. Diagnosis or treatment significantly limited by social determinants of health.        Final diagnoses:  Bilateral impacted cerumen    ED Discharge Orders     None          Luellen Howson A, PA-C 11/25/24 2000  "
